# Patient Record
Sex: Female | Born: 1955 | Race: White | Hispanic: No | Smoking: Former smoker
Health system: Southern US, Community
[De-identification: ages and names within clinical notes are randomized; demographics above are authoritative.]

## PROBLEM LIST (undated history)

## (undated) DIAGNOSIS — R251 Tremor, unspecified: Secondary | ICD-10-CM

## (undated) DIAGNOSIS — J449 Chronic obstructive pulmonary disease, unspecified: Secondary | ICD-10-CM

## (undated) DIAGNOSIS — F1010 Alcohol abuse, uncomplicated: Secondary | ICD-10-CM

## (undated) DIAGNOSIS — R42 Dizziness and giddiness: Secondary | ICD-10-CM

## (undated) DIAGNOSIS — I10 Essential (primary) hypertension: Secondary | ICD-10-CM

## (undated) DIAGNOSIS — R413 Other amnesia: Secondary | ICD-10-CM

## (undated) HISTORY — DX: Essential (primary) hypertension: I10

## (undated) HISTORY — DX: Dizziness and giddiness: R42

## (undated) HISTORY — PX: OTHER SURGICAL HISTORY: SHX169

## (undated) HISTORY — DX: Tremor, unspecified: R25.1

## (undated) HISTORY — DX: Other amnesia: R41.3

## (undated) HISTORY — PX: TONSILLECTOMY: SUR1361

## (undated) HISTORY — DX: Chronic obstructive pulmonary disease, unspecified: J44.9

## (undated) HISTORY — DX: Alcohol abuse, uncomplicated: F10.10

---

## 2018-08-15 ENCOUNTER — Other Ambulatory Visit: Payer: Self-pay | Admitting: Family Medicine

## 2018-08-15 DIAGNOSIS — R413 Other amnesia: Secondary | ICD-10-CM

## 2018-08-19 ENCOUNTER — Ambulatory Visit
Admission: RE | Admit: 2018-08-19 | Discharge: 2018-08-19 | Disposition: A | Discharge status: Home / Self Care | Payer: BLUE CROSS/BLUE SHIELD | Source: Ambulatory Visit | Attending: Family Medicine | Admitting: Family Medicine

## 2018-08-19 DIAGNOSIS — R413 Other amnesia: Secondary | ICD-10-CM

## 2018-09-01 ENCOUNTER — Encounter: Payer: Self-pay | Admitting: *Deleted

## 2018-09-01 ENCOUNTER — Ambulatory Visit: Payer: BLUE CROSS/BLUE SHIELD | Admitting: Diagnostic Neuroimaging

## 2018-09-01 ENCOUNTER — Encounter: Payer: Self-pay | Admitting: Diagnostic Neuroimaging

## 2018-09-01 VITALS — BP 122/83 | HR 80 | Ht 64.0 in | Wt 124.6 lb

## 2018-09-01 DIAGNOSIS — R413 Other amnesia: Secondary | ICD-10-CM | POA: Diagnosis not present

## 2018-09-01 NOTE — Patient Instructions (Signed)
-   reduce alcohol use (< 5 drinks per week)  - continue vitamin support  - brain healthy activities

## 2018-09-01 NOTE — Progress Notes (Signed)
[facility]  PATIENT: Lauren Elliott DOB: October 07, 1956  REFERRING CLINICIAN: A Brake HISTORY FROM: patient  REASON FOR VISIT: new consult    HISTORICAL  CHIEF COMPLAINT:  Chief Complaint  Patient presents with  . New Patient (Initial Visit)    Rm 7,   . Memory Loss    Dr. Drema Pry.  In [place], low K, Na 09/2017 ) may have memory loss.   . Tremors    intermittent tremors (whole body and then sometimes hands).     HISTORY OF PRESENT ILLNESS:   62 year old female here for evaluation of memory loss.  October 2018 patient was living in [place] when she developed confusion and memory loss.  Patient was admitted to the hospital due to severe low sodium and low potassium levels.  She was in the intensive care unit and treated with IV fluid and electrolyte replacement.  Patient improved and was discharged home.  Patient now living in [place].  Patient notes some mild memory loss but nothing significant.  She is able to take care of all of her activities of daily living.  She has noticed some tremor which also started around October 2018.  Patient drinks 6 to 12 ounces of alcohol per day.  Review of referral notes indicates that patient's daughter reports higher level of alcohol intake than patient admitting to.  Apparently patient is drinking up to 2-3 mixed drinks per day plus higher amounts in the past (6 beers per day).  However patient denies this level of drinking today.  She states that she drinks 2-3 times per week, but sometimes on a daily basis.   REVIEW OF SYSTEMS: Full 14 system review of systems performed and negative with exception of: Weight loss fatigue easy bruising memory loss insomnia sleepiness numbness weakness tremor joint pain incontinence diarrhea itching swelling shortness of breath too much sleep decreased energy.  ALLERGIES: Not on File  HOME MEDICATIONS: Outpatient Medications Prior to Visit  Medication Sig Dispense Refill  . acetaminophen  (TYLENOL) 325 MG tablet Take 325 mg by mouth every 4 (four) hours as needed.    Marland Kitchen albuterol (VENTOLIN HFA) 108 (90 Base) MCG/ACT inhaler Inhale 2 puffs into the lungs every 6 (six) hours as needed for wheezing or shortness of breath.    Marland Kitchen amLODipine (NORVASC) 5 MG tablet Take 5 mg by mouth daily.  0  . aspirin EC 81 MG tablet Take 81 mg by mouth daily.    . Multiple Vitamin (MULTIVITAMIN) tablet Take 1 tablet by mouth daily.    Marland Kitchen STIOLTO RESPIMAT 2.5-2.5 MCG/ACT AERS INHALE 2 PUFFS BY MOUTH EVERY DAY  0  . ondansetron (ZOFRAN) 4 MG tablet   0   No facility-administered medications prior to visit.     PAST MEDICAL HISTORY: Past Medical History:  Diagnosis Date  . Alcohol abuse   . COPD (chronic obstructive pulmonary disease) (HCC)   . Dizziness   . Hypertension   . Memory loss   . Tremor     PAST SURGICAL HISTORY: Past Surgical History:  Procedure Laterality Date  . BTL    . TONSILLECTOMY      FAMILY HISTORY: Family History  Problem Relation Age of Onset  . Kidney cancer Mother   . Failure to thrive Mother   . CAD Father   . COPD Father   . Renal cancer Maternal Grandmother   . Heart attack Maternal Grandfather   . Heart failure Paternal Grandmother   . Heart failure Paternal Grandfather   .  Cancer Brother   . Stroke Maternal Aunt     SOCIAL HISTORY: Social History   Socioeconomic History  . Marital status: Legally Separated    Spouse name: Not on file  . Number of children: Not on file  . Years of education: Not on file  . Highest education level: Not on file  Occupational History  . Not on file  Social Needs  . Financial resource strain: Not on file  . Food insecurity:    Worry: Not on file    Inability: Not on file  . Transportation needs:    Medical: Not on file    Non-medical: Not on file  Tobacco Use  . Smoking status: Former Games developer  . Smokeless tobacco: Never Used  Substance and Sexual Activity  . Alcohol use: Yes    Comment: 6-12 oz daily  .  Drug use: Not Currently    Comment: quit yrs ago  . Sexual activity: Not on file  Lifestyle  . Physical activity:    Days per week: Not on file    Minutes per session: Not on file  . Stress: Not on file  Relationships  . Social connections:    Talks on phone: Not on file    Gets together: Not on file    Attends religious service: Not on file    Active member of club or organization: Not on file    Attends meetings of clubs or organizations: Not on file    Relationship status: Not on file  . Intimate partner violence:    Fear of current or ex partner: Not on file    Emotionally abused: Not on file    Physically abused: Not on file    Forced sexual activity: Not on file  Other Topics Concern  . Not on file  Social History Narrative   Lives at home with partner, Silvestre Moment.  Has 3 children.  Retired Charity fundraiser. Electronics engineer     PHYSICAL EXAM  GENERAL EXAM/CONSTITUTIONAL: Vitals:  Vitals:   09/01/18 1526  BP: 122/83  Pulse: 80  Weight: 124 lb 9.6 oz (56.5 kg)  Height: 5\' 4"  (1.626 m)     Body mass index is 21.39 kg/m. Wt Readings from Last 3 Encounters:  09/01/18 124 lb 9.6 oz (56.5 kg)     Patient is in no distress; well developed, nourished and groomed; neck is supple  CARDIOVASCULAR:  Examination of carotid arteries is normal; no carotid bruits  Regular rate and rhythm, no murmurs  Examination of peripheral vascular system by observation and palpation is normal  EYES:  Ophthalmoscopic exam of optic discs and posterior segments is normal; no papilledema or hemorrhages  No exam data present  MUSCULOSKELETAL:  Gait, strength, tone, movements noted in Neurologic exam below  NEUROLOGIC: MENTAL STATUS:  MMSE - Mini Mental State Exam 09/01/2018  Orientation to time 4  Orientation to Place 5  Registration 3  Attention/ Calculation 4  Recall 2  Language- name 2 objects 2  Language- repeat 1  Language- follow 3 step command 3  Language- read & follow  direction 1  Write a sentence 1  Copy design 1  Total score 27    awake, alert, oriented to person, place and time  recent and remote memory intact  normal attention and concentration  language fluent, comprehension intact, naming intact  fund of knowledge appropriate  CRANIAL NERVE:   2nd - no papilledema on fundoscopic exam  2nd, 3rd, 4th, 6th - pupils equal and  reactive to light, visual fields full to confrontation, extraocular muscles intact, no nystagmus  5th - facial sensation symmetric  7th - facial strength symmetric  8th - hearing intact  9th - palate elevates symmetrically, uvula midline  11th - shoulder shrug symmetric  12th - tongue protrusion midline  MOTOR:   normal bulk and tone, full strength in the BUE, BLE  POSTURAL TREMOR IN BUE AND HEAD  SENSORY:   normal and symmetric to light touch, temperature, vibration  COORDINATION:   finger-nose-finger, fine finger movements normal  REFLEXES:   deep tendon reflexes TRACE and symmetric  GAIT/STATION:   narrow based gait; MILD DIFF WITH TANDEM GAIT     DIAGNOSTIC DATA (LABS, IMAGING, TESTING) - I reviewed patient records, labs, notes, testing and imaging myself where available.  No results found for: WBC, HGB, HCT, MCV, PLT No results found for: NA, K, CL, CO2, GLUCOSE, BUN, CREATININE, CALCIUM, PROT, ALBUMIN, AST, ALT, ALKPHOS, BILITOT, GFRNONAA, GFRAA No results found for: CHOL, HDL, LDLCALC, LDLDIRECT, TRIG, CHOLHDL No results found for: FBXU3Y No results found for: VITAMINB12 No results found for: TSH   08/19/18 CT head [I reviewed images myself and agree with interpretation. -VRP]  - normal    ASSESSMENT AND PLAN  62 y.o. year old female here with memory loss since October 2018, when patient was critically ill in the hospital due to electrolyte derangements.  Also with questionable history of alcohol abuse by chart review, although patient downplays and denies any significant  excessive alcohol use.   Dx:  1. Memory loss     PLAN:  - reduce alcohol use (< 5 drinks per week) - continue vitamin support - brain healthy activities  Return if symptoms worsen or fail to improve, for return to PCP.    Suanne Marker, MD 09/01/2018, 3:43 PM Certified in Neurology, Neurophysiology and Neuroimaging  [facility] [address], [address] [place], [place] [address] [phone]

## 2018-09-05 ENCOUNTER — Encounter: Payer: Self-pay | Admitting: Diagnostic Neuroimaging

## 2018-09-25 ENCOUNTER — Other Ambulatory Visit: Payer: Self-pay | Admitting: Family Medicine

## 2018-09-25 DIAGNOSIS — R1900 Intra-abdominal and pelvic swelling, mass and lump, unspecified site: Secondary | ICD-10-CM

## 2018-09-30 ENCOUNTER — Encounter

## 2018-09-30 ENCOUNTER — Ambulatory Visit: Payer: BLUE CROSS/BLUE SHIELD | Admitting: Neurology

## 2018-10-03 ENCOUNTER — Ambulatory Visit
Admission: RE | Admit: 2018-10-03 | Discharge: 2018-10-03 | Disposition: A | Discharge status: Home / Self Care | Payer: BLUE CROSS/BLUE SHIELD | Source: Ambulatory Visit | Attending: Family Medicine | Admitting: Family Medicine

## 2018-10-03 DIAGNOSIS — R1900 Intra-abdominal and pelvic swelling, mass and lump, unspecified site: Secondary | ICD-10-CM

## 2018-11-25 ENCOUNTER — Encounter

## 2019-01-24 IMAGING — US US ABDOMEN COMPLETE
1 series · 14 of 25 positions shown · non-contrast
Comparison: None.

CLINICAL DATA: Abdominal swelling.  Assess for ascites.

EXAM:
ABDOMEN ULTRASOUND COMPLETE

[Series 1: us abdomen complete · 0.17mm/px · 14 of 87 slices shown]
[im 1/87]
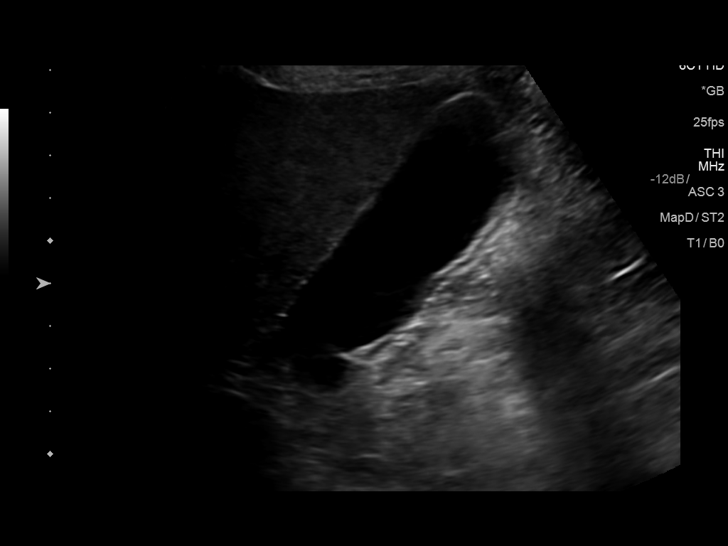
[im 8/87]
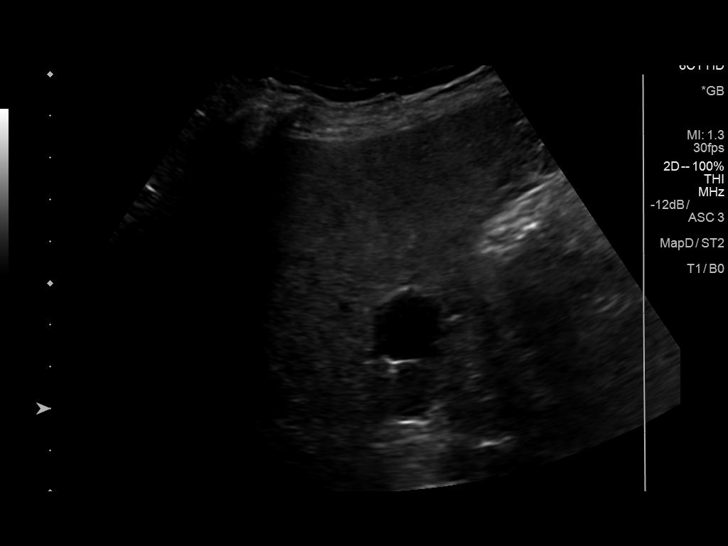
[im 15/87]
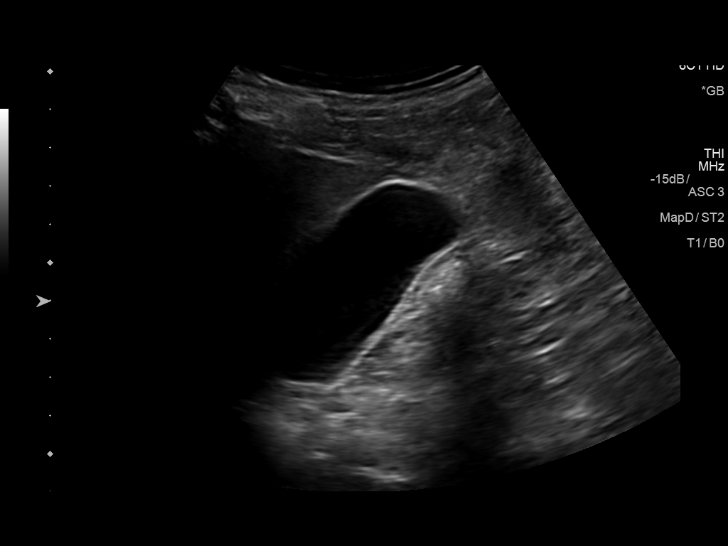
[im 22/87]
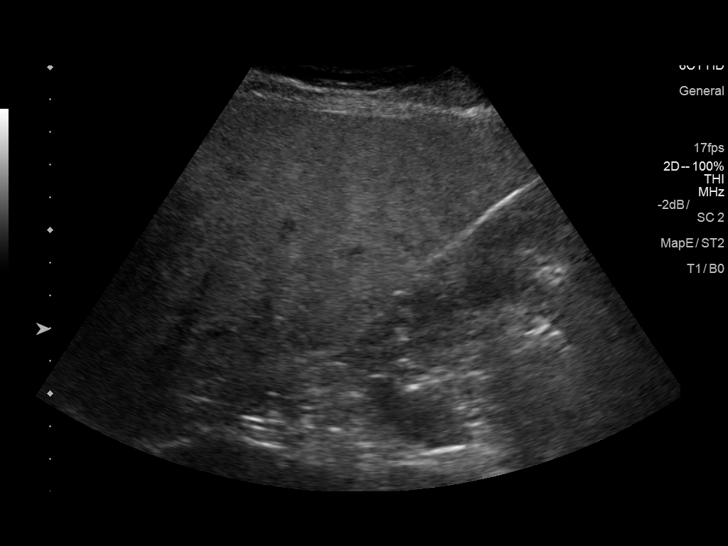
[im 29/87]
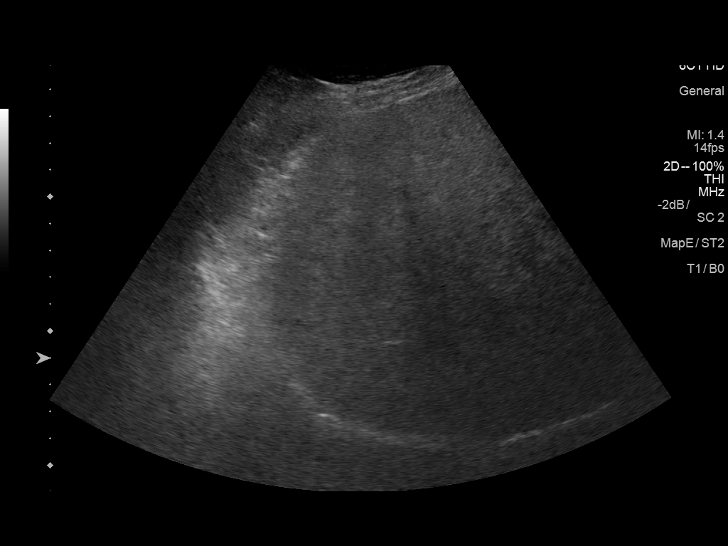
[im 33/87]
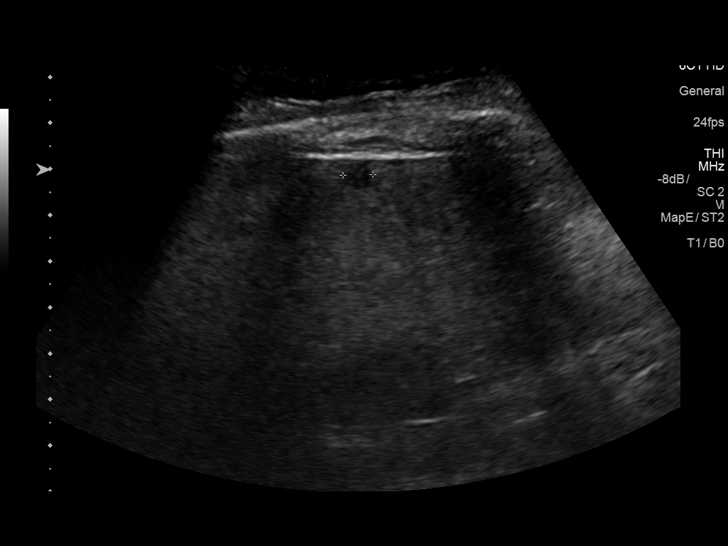
[im 40/87]
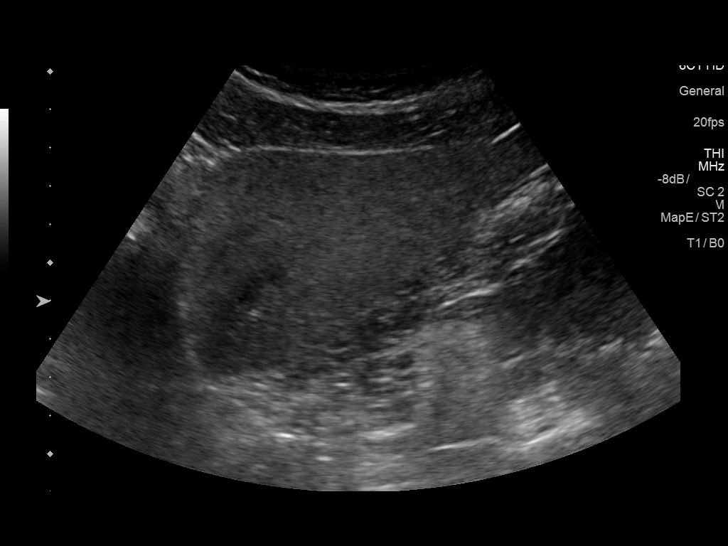
[im 47/87]
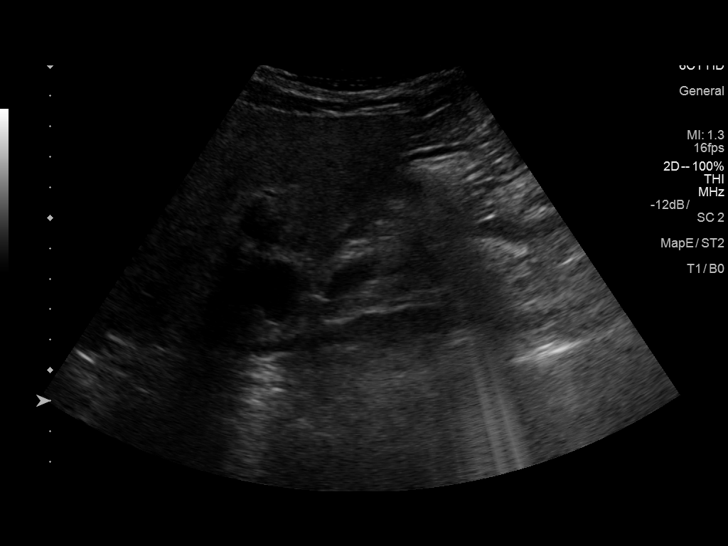
[im 54/87]
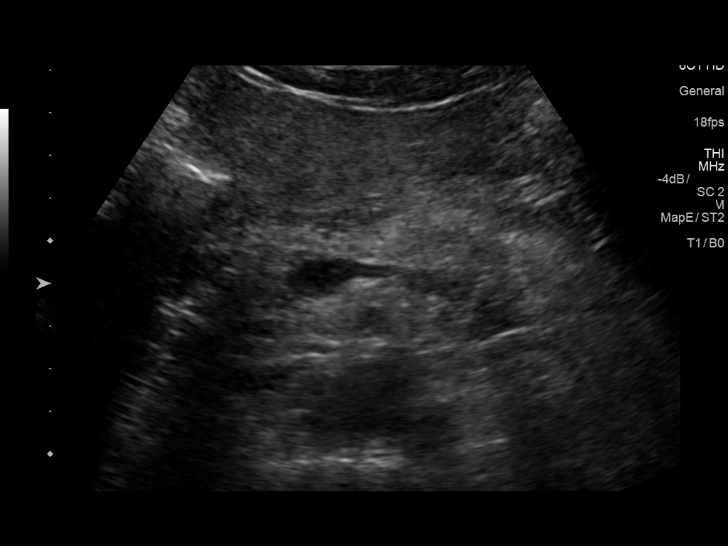
[im 58/87]
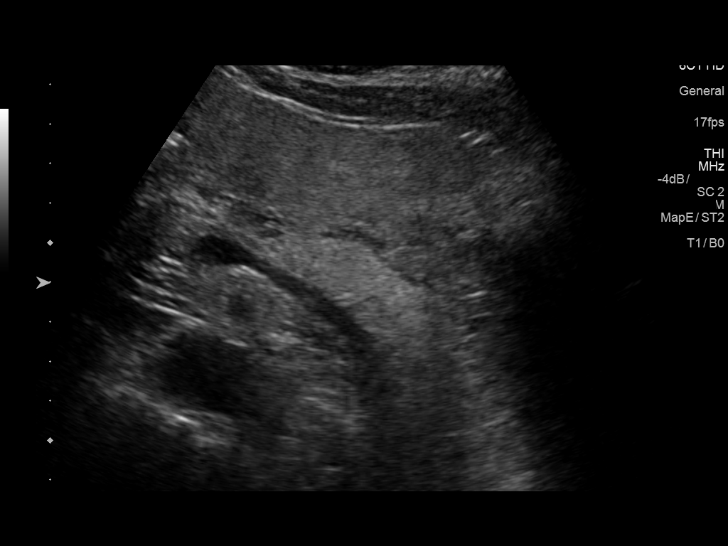
[im 65/87]
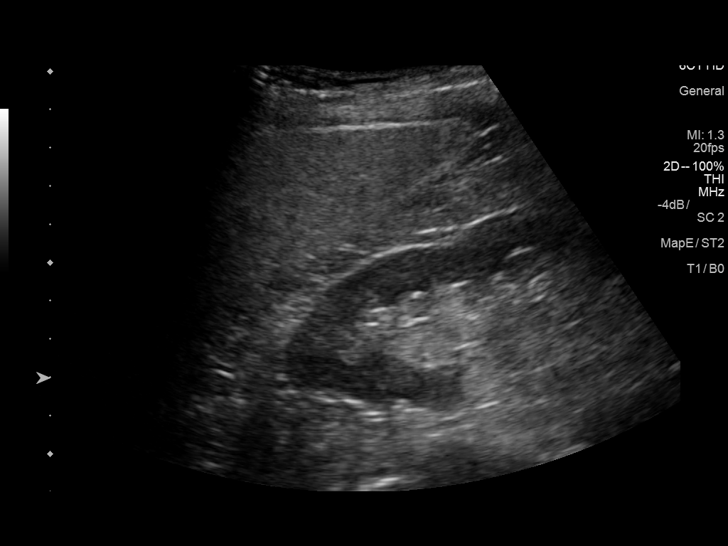
[im 72/87]
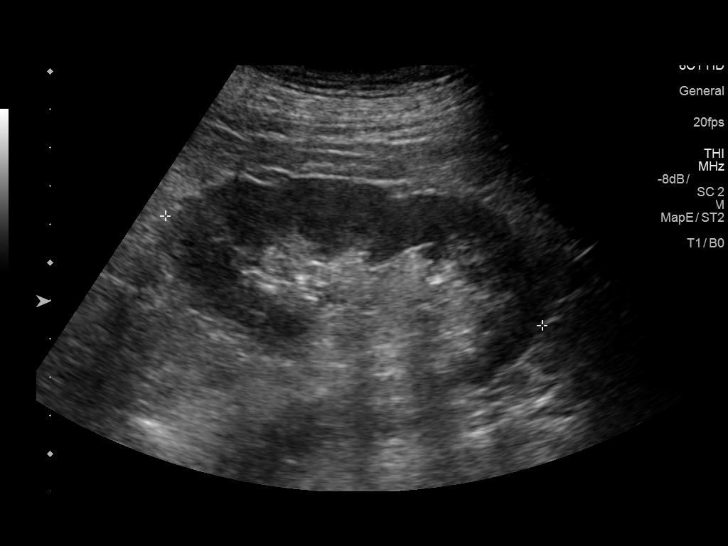
[im 79/87]
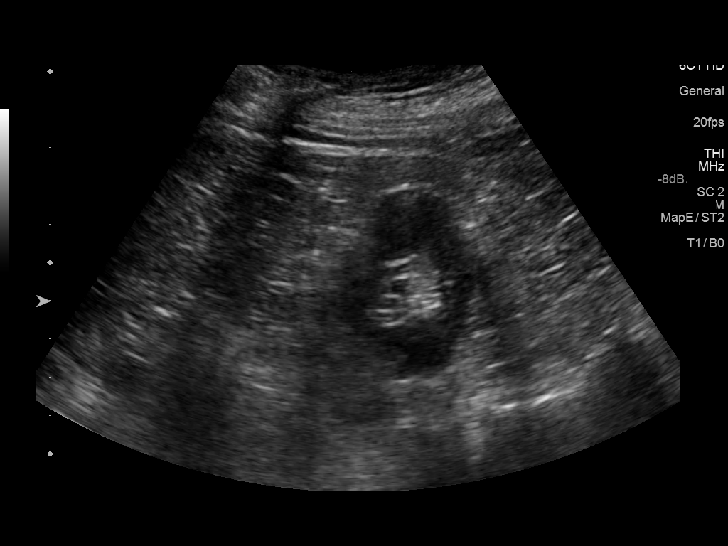
[im 87/87]
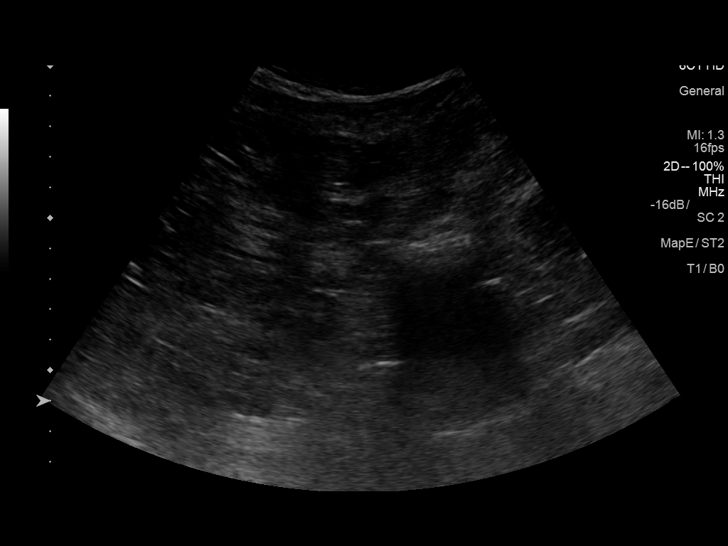

[14 of 25 positions shown; findings below may reference images not displayed]

FINDINGS: Gallbladder: No gallstones or wall thickening visualized. No
sonographic Murphy sign noted by sonographer.

Common bile duct: Diameter: 3.7 mm

Liver: There is inhomogeneous increased echotexture of the liver.
There is a 0.7 cm cyst in the right lobe liver. Portal vein is
patent on color Doppler imaging with normal direction of blood flow
towards the liver.

IVC: No abnormality visualized.

Pancreas: Visualized portion unremarkable.

Spleen: Size and appearance within normal limits.

Right Kidney: Length: 10.6 cm. Echogenicity within normal limits. No
mass or hydronephrosis visualized.

Left Kidney: Length: 10.3 cm. Echogenicity within normal limits. No
mass or hydronephrosis visualized.

Abdominal aorta: No aneurysm visualized.

Other findings: None.  No ascites is identified.
IMPRESSION: No ascites identified.

Inhomogeneous increased echotexture of the liver. This is
nonspecific but can be seen in fatty infiltration of liver. 0.7 cm
simple cyst in the right lobe liver.

## 2019-12-15 IMAGING — CT CT HEAD W/O CM
2 series · 16 of 40 positions shown, 20 images · non-contrast
Comparison: None.

CLINICAL DATA: Recent memory loss

EXAM:
CT HEAD WITHOUT CONTRAST
TECHNIQUE: Contiguous axial images were obtained from the base of the skull
through the vertex without intravenous contrast.

[Series 2: head w/(date) · axial · 0.41mm/px · z∈[-125,+15]mm · 13 of 34 slices shown, 17 images]
[im 3/34  brain]
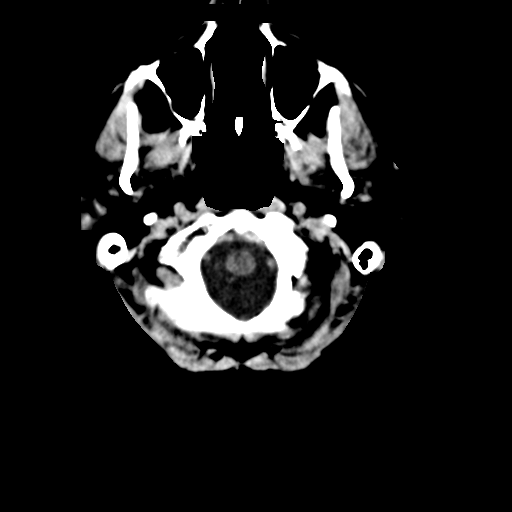
[im 3/34  bone]
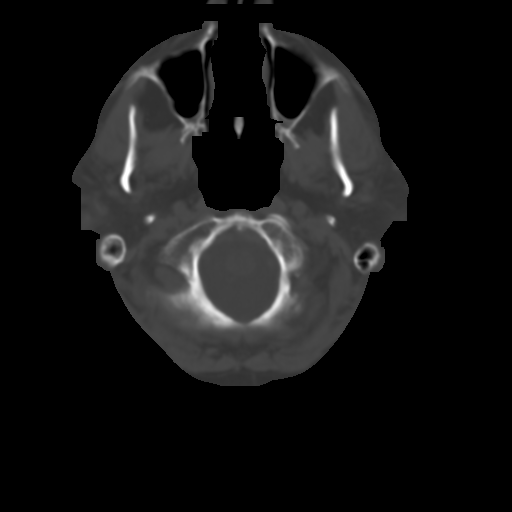
[im 5/34  brain]
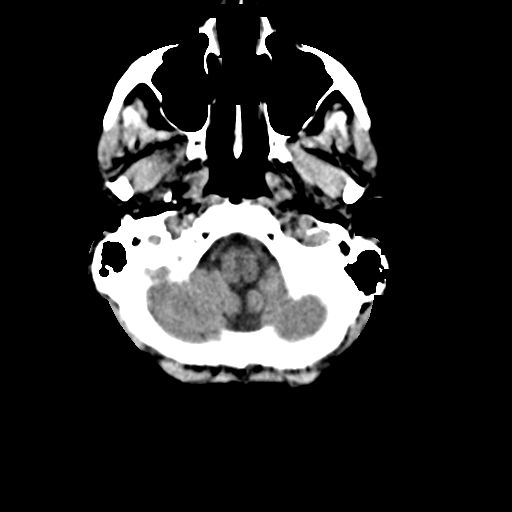
[im 7/34  brain]
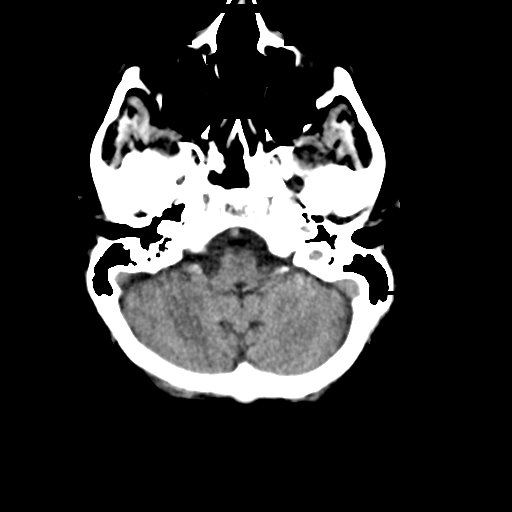
[im 10/34  brain]
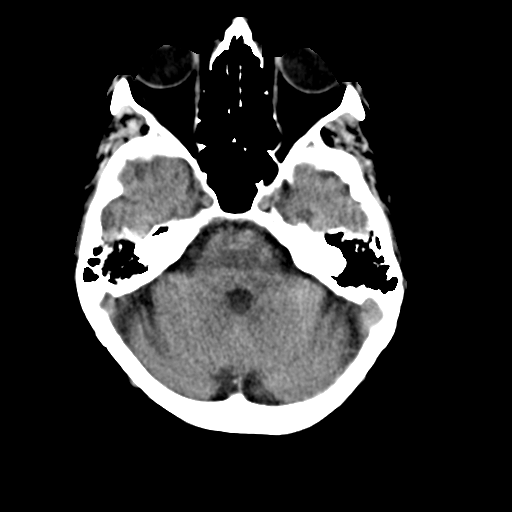
[im 12/34  brain]
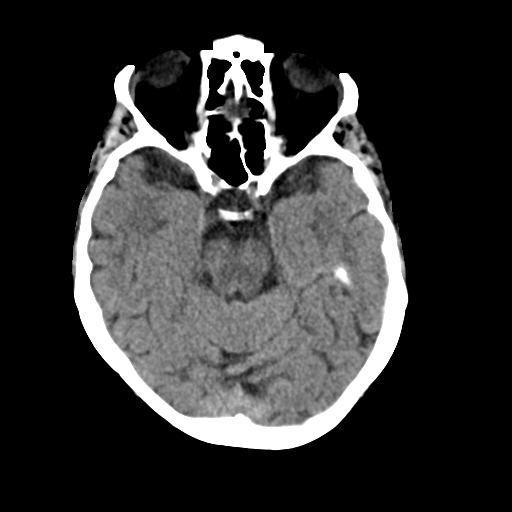
[im 12/34  bone]
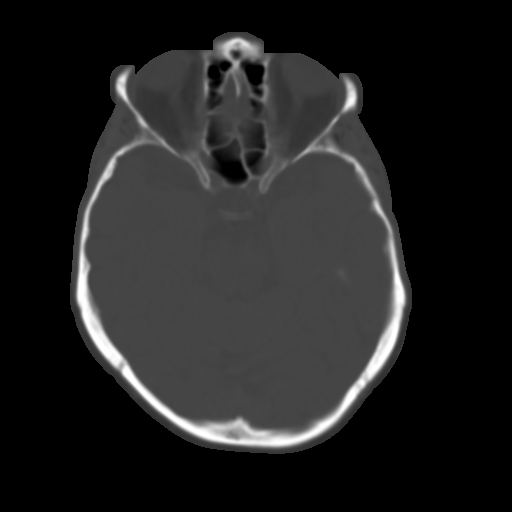
[im 14/34  brain]
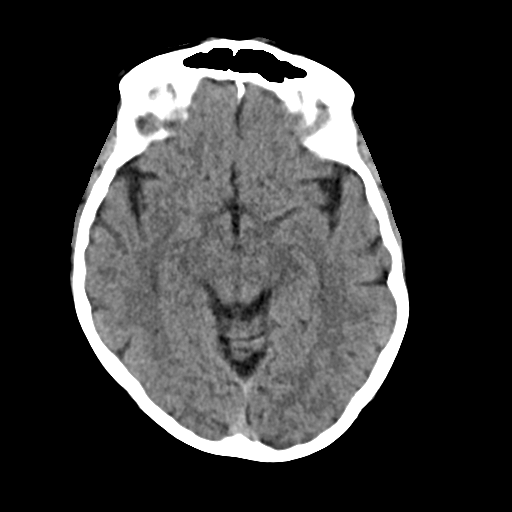
[im 18/34  brain]
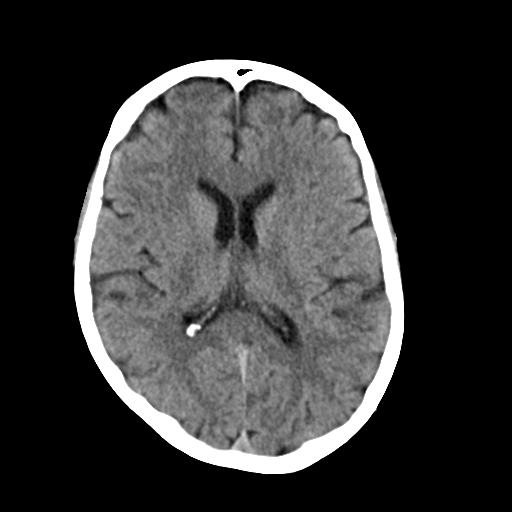
[im 20/34  brain]
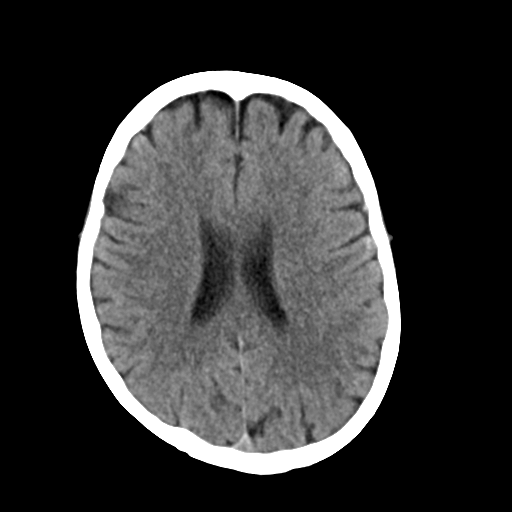
[im 22/34  brain]
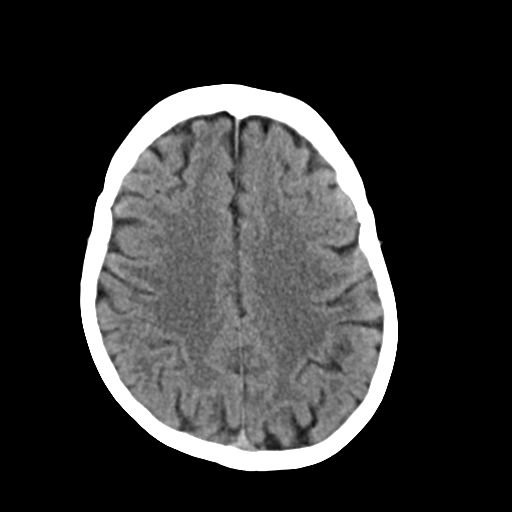
[im 22/34  bone]
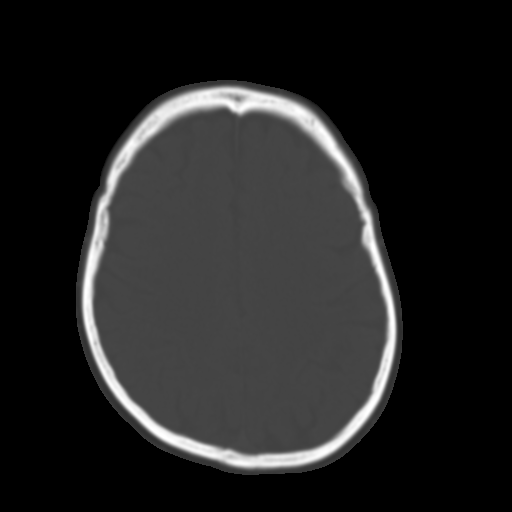
[im 24/34  brain]
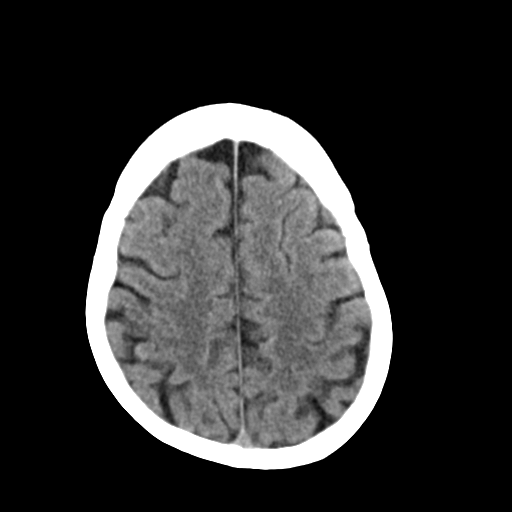
[im 27/34  brain]
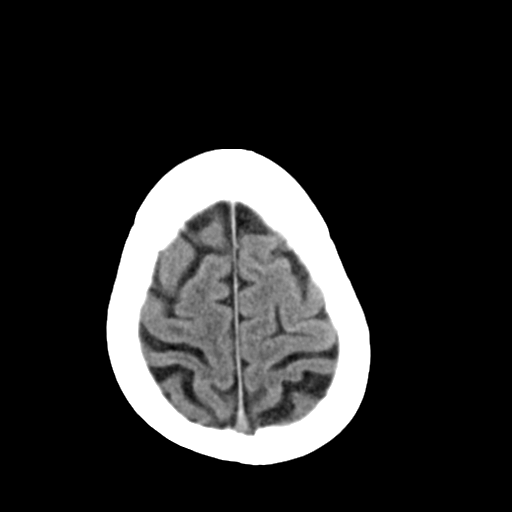
[im 29/34  brain]
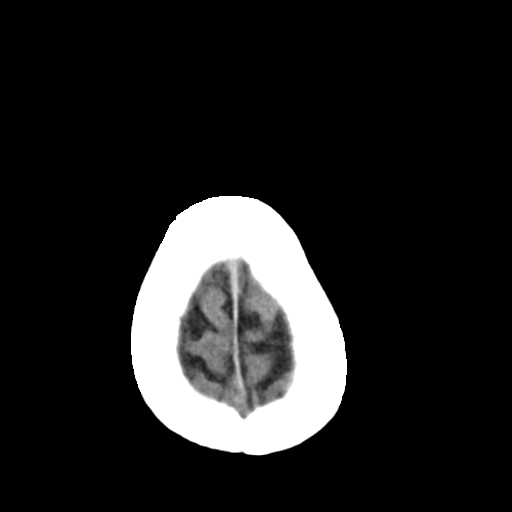
[im 31/34  brain]
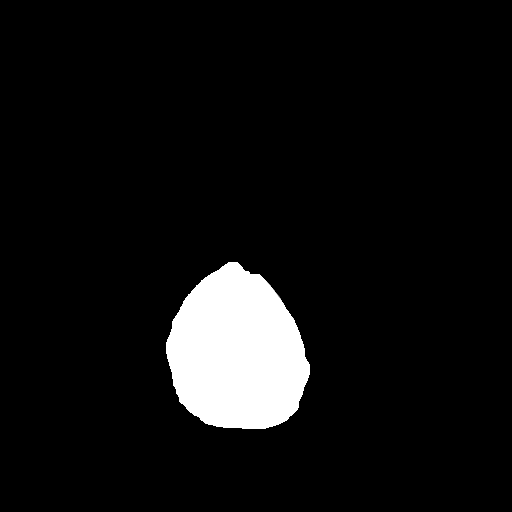
[im 31/34  bone]
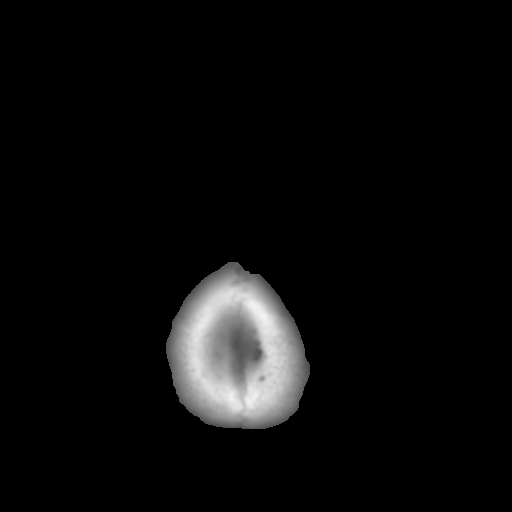

[Series 4: cor · coronal · 0.33mm/px · 3 of 69 slices shown]
[im 23/69  brain]
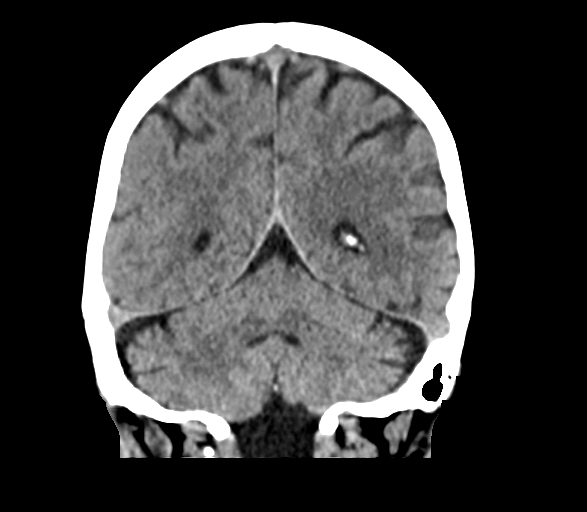
[im 31/69  brain]
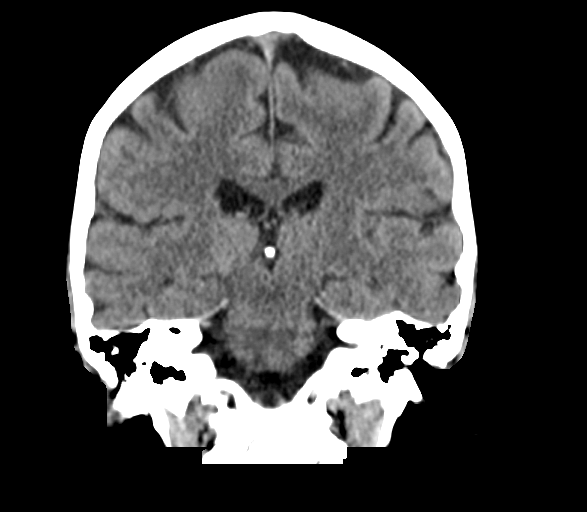
[im 38/69  brain]
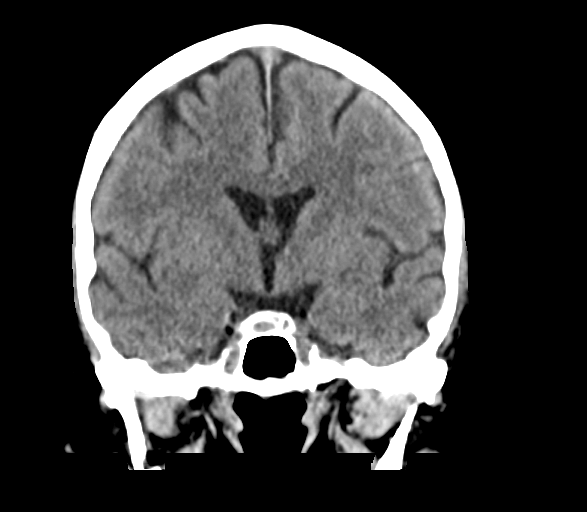

[16 of 40 positions shown; findings below may reference images not displayed]

FINDINGS: Brain: No evidence of acute infarction, hemorrhage, hydrocephalus,
extra-axial collection or mass lesion/mass effect.

Vascular: No hyperdense vessel or unexpected calcification.

Skull: Normal. Negative for fracture or focal lesion.

Sinuses/Orbits: No acute finding.

Other: None.
IMPRESSION: Normal head CT for age

## 2020-04-29 DIAGNOSIS — F1010 Alcohol abuse, uncomplicated: Secondary | ICD-10-CM | POA: Diagnosis not present

## 2020-04-29 DIAGNOSIS — J449 Chronic obstructive pulmonary disease, unspecified: Secondary | ICD-10-CM | POA: Diagnosis not present

## 2020-04-29 DIAGNOSIS — I10 Essential (primary) hypertension: Secondary | ICD-10-CM | POA: Diagnosis not present

## 2020-04-29 DIAGNOSIS — K582 Mixed irritable bowel syndrome: Secondary | ICD-10-CM | POA: Diagnosis not present

## 2020-04-29 DIAGNOSIS — E559 Vitamin D deficiency, unspecified: Secondary | ICD-10-CM | POA: Diagnosis not present

## 2020-04-29 DIAGNOSIS — R110 Nausea: Secondary | ICD-10-CM | POA: Diagnosis not present

## 2020-07-01 DIAGNOSIS — R69 Illness, unspecified: Secondary | ICD-10-CM | POA: Diagnosis not present

## 2020-08-09 DIAGNOSIS — E782 Mixed hyperlipidemia: Secondary | ICD-10-CM | POA: Diagnosis not present

## 2020-08-09 DIAGNOSIS — Z0000 Encounter for general adult medical examination without abnormal findings: Secondary | ICD-10-CM | POA: Diagnosis not present

## 2020-08-09 DIAGNOSIS — I10 Essential (primary) hypertension: Secondary | ICD-10-CM | POA: Diagnosis not present

## 2020-08-09 DIAGNOSIS — K582 Mixed irritable bowel syndrome: Secondary | ICD-10-CM | POA: Diagnosis not present

## 2020-08-09 DIAGNOSIS — E2839 Other primary ovarian failure: Secondary | ICD-10-CM | POA: Diagnosis not present

## 2020-08-09 DIAGNOSIS — Z1231 Encounter for screening mammogram for malignant neoplasm of breast: Secondary | ICD-10-CM | POA: Diagnosis not present

## 2020-08-09 DIAGNOSIS — E559 Vitamin D deficiency, unspecified: Secondary | ICD-10-CM | POA: Diagnosis not present

## 2020-08-09 DIAGNOSIS — R634 Abnormal weight loss: Secondary | ICD-10-CM | POA: Diagnosis not present

## 2020-08-09 DIAGNOSIS — J449 Chronic obstructive pulmonary disease, unspecified: Secondary | ICD-10-CM | POA: Diagnosis not present

## 2020-08-11 ENCOUNTER — Other Ambulatory Visit: Payer: Self-pay | Admitting: Family Medicine

## 2020-08-11 DIAGNOSIS — Z1231 Encounter for screening mammogram for malignant neoplasm of breast: Secondary | ICD-10-CM

## 2020-08-11 DIAGNOSIS — R5381 Other malaise: Secondary | ICD-10-CM

## 2020-08-12 ENCOUNTER — Other Ambulatory Visit: Payer: Self-pay | Admitting: Family Medicine

## 2020-08-12 DIAGNOSIS — E2839 Other primary ovarian failure: Secondary | ICD-10-CM

## 2020-09-08 ENCOUNTER — Other Ambulatory Visit: Payer: Self-pay

## 2020-09-08 ENCOUNTER — Ambulatory Visit
Admission: RE | Admit: 2020-09-08 | Discharge: 2020-09-08 | Disposition: A | Discharge status: Home / Self Care | Payer: BLUE CROSS/BLUE SHIELD | Source: Ambulatory Visit | Attending: Family Medicine | Admitting: Family Medicine

## 2020-09-08 DIAGNOSIS — Z1231 Encounter for screening mammogram for malignant neoplasm of breast: Secondary | ICD-10-CM

## 2020-11-22 ENCOUNTER — Ambulatory Visit
Admission: RE | Admit: 2020-11-22 | Discharge: 2020-11-22 | Disposition: A | Discharge status: Home / Self Care | Payer: Medicare HMO | Source: Ambulatory Visit | Attending: Family Medicine | Admitting: Family Medicine

## 2020-11-22 ENCOUNTER — Other Ambulatory Visit: Payer: Self-pay

## 2020-11-22 DIAGNOSIS — E2839 Other primary ovarian failure: Secondary | ICD-10-CM

## 2020-11-22 DIAGNOSIS — M810 Age-related osteoporosis without current pathological fracture: Secondary | ICD-10-CM | POA: Diagnosis not present

## 2020-11-22 DIAGNOSIS — Z780 Asymptomatic menopausal state: Secondary | ICD-10-CM | POA: Diagnosis not present

## 2020-12-16 DIAGNOSIS — R140 Abdominal distension (gaseous): Secondary | ICD-10-CM | POA: Diagnosis not present

## 2020-12-16 DIAGNOSIS — R188 Other ascites: Secondary | ICD-10-CM | POA: Diagnosis not present

## 2020-12-19 DIAGNOSIS — R1900 Intra-abdominal and pelvic swelling, mass and lump, unspecified site: Secondary | ICD-10-CM | POA: Diagnosis not present

## 2020-12-19 DIAGNOSIS — K429 Umbilical hernia without obstruction or gangrene: Secondary | ICD-10-CM | POA: Diagnosis not present

## 2020-12-19 DIAGNOSIS — R140 Abdominal distension (gaseous): Secondary | ICD-10-CM | POA: Diagnosis not present

## 2020-12-19 DIAGNOSIS — R1084 Generalized abdominal pain: Secondary | ICD-10-CM | POA: Diagnosis not present

## 2020-12-19 DIAGNOSIS — R109 Unspecified abdominal pain: Secondary | ICD-10-CM | POA: Diagnosis not present

## 2020-12-19 DIAGNOSIS — F1010 Alcohol abuse, uncomplicated: Secondary | ICD-10-CM | POA: Diagnosis not present

## 2020-12-20 ENCOUNTER — Other Ambulatory Visit: Payer: Medicare HMO | Admitting: Physician Assistant

## 2020-12-20 DIAGNOSIS — R1900 Intra-abdominal and pelvic swelling, mass and lump, unspecified site: Secondary | ICD-10-CM

## 2020-12-20 DIAGNOSIS — R109 Unspecified abdominal pain: Secondary | ICD-10-CM

## 2021-01-03 ENCOUNTER — Other Ambulatory Visit: Payer: Self-pay

## 2021-01-03 ENCOUNTER — Ambulatory Visit
Admission: RE | Admit: 2021-01-03 | Discharge: 2021-01-03 | Disposition: A | Discharge status: Home / Self Care | Payer: Medicare HMO | Source: Ambulatory Visit | Attending: Physician Assistant | Admitting: Physician Assistant

## 2021-01-03 DIAGNOSIS — R109 Unspecified abdominal pain: Secondary | ICD-10-CM

## 2021-01-03 DIAGNOSIS — K3189 Other diseases of stomach and duodenum: Secondary | ICD-10-CM | POA: Diagnosis not present

## 2021-01-03 DIAGNOSIS — R140 Abdominal distension (gaseous): Secondary | ICD-10-CM | POA: Diagnosis not present

## 2021-01-03 DIAGNOSIS — I700 Atherosclerosis of aorta: Secondary | ICD-10-CM | POA: Diagnosis not present

## 2021-01-03 DIAGNOSIS — K5730 Diverticulosis of large intestine without perforation or abscess without bleeding: Secondary | ICD-10-CM | POA: Diagnosis not present

## 2021-01-03 DIAGNOSIS — R1900 Intra-abdominal and pelvic swelling, mass and lump, unspecified site: Secondary | ICD-10-CM

## 2021-01-03 MED ORDER — IOPAMIDOL (ISOVUE-300) INJECTION 61%
100.0000 mL | Freq: Once | INTRAVENOUS | Status: AC | PRN
  Administered 2021-01-03: 100 mL via INTRAVENOUS

## 2021-01-11 DIAGNOSIS — K582 Mixed irritable bowel syndrome: Secondary | ICD-10-CM | POA: Diagnosis not present

## 2021-01-11 DIAGNOSIS — R140 Abdominal distension (gaseous): Secondary | ICD-10-CM | POA: Diagnosis not present

## 2021-01-11 DIAGNOSIS — F1010 Alcohol abuse, uncomplicated: Secondary | ICD-10-CM | POA: Diagnosis not present

## 2021-01-11 DIAGNOSIS — Z1211 Encounter for screening for malignant neoplasm of colon: Secondary | ICD-10-CM | POA: Diagnosis not present

## 2021-01-24 DIAGNOSIS — Z1211 Encounter for screening for malignant neoplasm of colon: Secondary | ICD-10-CM | POA: Diagnosis not present

## 2021-02-14 DIAGNOSIS — J449 Chronic obstructive pulmonary disease, unspecified: Secondary | ICD-10-CM | POA: Diagnosis not present

## 2021-05-31 DIAGNOSIS — H524 Presbyopia: Secondary | ICD-10-CM | POA: Diagnosis not present

## 2021-08-23 DIAGNOSIS — I10 Essential (primary) hypertension: Secondary | ICD-10-CM | POA: Diagnosis not present

## 2021-08-23 DIAGNOSIS — J449 Chronic obstructive pulmonary disease, unspecified: Secondary | ICD-10-CM | POA: Diagnosis not present

## 2021-08-23 DIAGNOSIS — Z0000 Encounter for general adult medical examination without abnormal findings: Secondary | ICD-10-CM | POA: Diagnosis not present

## 2021-08-23 DIAGNOSIS — E559 Vitamin D deficiency, unspecified: Secondary | ICD-10-CM | POA: Diagnosis not present

## 2021-08-23 DIAGNOSIS — E782 Mixed hyperlipidemia: Secondary | ICD-10-CM | POA: Diagnosis not present

## 2021-09-03 DIAGNOSIS — H60501 Unspecified acute noninfective otitis externa, right ear: Secondary | ICD-10-CM | POA: Diagnosis not present

## 2021-09-03 DIAGNOSIS — L309 Dermatitis, unspecified: Secondary | ICD-10-CM | POA: Diagnosis not present

## 2021-09-11 ENCOUNTER — Other Ambulatory Visit: Payer: Self-pay | Admitting: Family Medicine

## 2021-09-11 DIAGNOSIS — Z1231 Encounter for screening mammogram for malignant neoplasm of breast: Secondary | ICD-10-CM

## 2021-09-14 ENCOUNTER — Ambulatory Visit
Admission: RE | Admit: 2021-09-14 | Discharge: 2021-09-14 | Disposition: A | Discharge status: Home / Self Care | Payer: Medicare HMO | Source: Ambulatory Visit | Attending: Family Medicine | Admitting: Family Medicine

## 2021-09-14 ENCOUNTER — Other Ambulatory Visit: Payer: Self-pay

## 2021-09-14 DIAGNOSIS — Z1231 Encounter for screening mammogram for malignant neoplasm of breast: Secondary | ICD-10-CM

## 2021-09-19 DIAGNOSIS — I10 Essential (primary) hypertension: Secondary | ICD-10-CM | POA: Diagnosis not present

## 2022-03-02 DIAGNOSIS — E559 Vitamin D deficiency, unspecified: Secondary | ICD-10-CM | POA: Diagnosis not present

## 2022-03-02 DIAGNOSIS — I10 Essential (primary) hypertension: Secondary | ICD-10-CM | POA: Diagnosis not present

## 2022-03-02 DIAGNOSIS — J449 Chronic obstructive pulmonary disease, unspecified: Secondary | ICD-10-CM | POA: Diagnosis not present

## 2022-03-02 DIAGNOSIS — M25559 Pain in unspecified hip: Secondary | ICD-10-CM | POA: Diagnosis not present

## 2022-03-02 DIAGNOSIS — Z0000 Encounter for general adult medical examination without abnormal findings: Secondary | ICD-10-CM | POA: Diagnosis not present

## 2022-03-02 DIAGNOSIS — R109 Unspecified abdominal pain: Secondary | ICD-10-CM | POA: Diagnosis not present

## 2022-03-02 DIAGNOSIS — E782 Mixed hyperlipidemia: Secondary | ICD-10-CM | POA: Diagnosis not present

## 2022-03-15 DIAGNOSIS — R350 Frequency of micturition: Secondary | ICD-10-CM | POA: Diagnosis not present

## 2022-03-15 DIAGNOSIS — L301 Dyshidrosis [pompholyx]: Secondary | ICD-10-CM | POA: Diagnosis not present

## 2022-04-30 DIAGNOSIS — R69 Illness, unspecified: Secondary | ICD-10-CM | POA: Diagnosis not present

## 2022-05-24 DIAGNOSIS — F4323 Adjustment disorder with mixed anxiety and depressed mood: Secondary | ICD-10-CM | POA: Diagnosis not present

## 2022-08-20 ENCOUNTER — Ambulatory Visit (INDEPENDENT_AMBULATORY_CARE_PROVIDER_SITE_OTHER): Payer: Medicare HMO | Admitting: Clinical

## 2022-08-20 DIAGNOSIS — F4321 Adjustment disorder with depressed mood: Secondary | ICD-10-CM
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
## 2022-08-20 NOTE — Progress Notes (Unsigned)
                Mellony Danziger, LCSW 

## 2022-08-24 DIAGNOSIS — Z0000 Encounter for general adult medical examination without abnormal findings: Secondary | ICD-10-CM | POA: Diagnosis not present

## 2022-08-24 DIAGNOSIS — I10 Essential (primary) hypertension: Secondary | ICD-10-CM | POA: Diagnosis not present

## 2022-08-24 DIAGNOSIS — J449 Chronic obstructive pulmonary disease, unspecified: Secondary | ICD-10-CM | POA: Diagnosis not present

## 2022-08-24 DIAGNOSIS — E782 Mixed hyperlipidemia: Secondary | ICD-10-CM | POA: Diagnosis not present

## 2022-08-24 DIAGNOSIS — E559 Vitamin D deficiency, unspecified: Secondary | ICD-10-CM | POA: Diagnosis not present

## 2022-08-28 ENCOUNTER — Ambulatory Visit (INDEPENDENT_AMBULATORY_CARE_PROVIDER_SITE_OTHER): Payer: Medicare HMO | Admitting: Clinical

## 2022-08-28 DIAGNOSIS — F4321 Adjustment disorder with depressed mood: Secondary | ICD-10-CM

## 2022-08-28 NOTE — Progress Notes (Signed)
                Ricco Dershem, LCSW 

## 2022-08-28 NOTE — Progress Notes (Signed)
[facility] Counselor/Therapist Progress Note  Patient ID: Lauren Elliott, MRN: [id number]    Date: 08/28/22  Time Spent: 9:30  am - 10:19 am : 49 Minutes  Treatment Type: Individual Therapy.  Reported Symptoms: Patient reported "pretty good, I guess"  when clinician inquired about patient's mood since last session. Patient reported depressed mood yesterday after disagreement with her partner. Patient became tearful during session when discussing disagreement with her partner. Patient reported continued difficulty falling asleep and staying asleep.   Mental Status Exam: Appearance:  Neat  Behavior: Appropriate  Motor: Normal  Speech/Language:  Clear and Coherent  Affect: Tearful  Mood: sad  Thought process: normal  Thought content:   WNL  Sensory/Perceptual disturbances:   WNL  Orientation: oriented to person and place  Attention: Good  Concentration: Good  Memory: WNL  Fund of knowledge:  Good  Insight:   Good  Judgment:  Good  Impulse Control: Good   Risk Assessment: Danger to Self:   Patient denied current suicidal ideation, homicidal ideation, and symptoms of psychosis.  Self-injurious Behavior: No Danger to Others: No Duty to Warn:no Physical Aggression / Violence:No  Access to Firearms a concern:  Patient reported antique gun in the home but no firing pin in the gun Gang Involvement:No   Subjective:  Patient stated, "I'm good, whatever helps" in response to recommendation for individual therapy.  Patient reported she misses the close relationships she had with family members prior to family members passing away. Patient reported she misses spending time with her daughters. Patient reported [place].   Interventions:  Clinician reviewed diagnosis and recommendations with patient. Clinician provided psycho education related to diagnosis of Adjustment Disorder with depressed mood and use of Cognitive Behavioral Therapy.  Clinician utilized a task centered approach in collaboration with patient to develop goals of treatment plan. Clinician requested patient complete thought record for homework.   Diagnosis:  Adjustment Disorder with depressed mood   Plan: Patient is to utilize [employer] Therapy, mindfulness, coping strategies, relaxation techniques, and develop healthy sleep habits to decrease symptoms associated with Adjustment Disorder with depressed mood.    Long-term goal:   Patient stated "peace of mind" as a long term goal.    Reduce depressive symptoms as evidence by reduction in depressed mood, decreased appetite, difficulty falling and staying asleep, crying, and irritability from 3 days per week to 0 days per week   Short-term goal:  Develop a strategy to re-establish communication with patient's daughter   Examine patient's sleep pattern/routine to develop strategies to improve sleep habits  Develop an understanding of the connection between patient's feelings of depressed mood, decreased appetite, difficulty falling asleep and staying asleep, crying, irritability and the impact on patient's thoughts and behaviors to develop coping strategies to utilize in response to stressors   Lauren Braun H. [place], LCSW                    Lauren Elliott, [place]

## 2022-08-30 ENCOUNTER — Ambulatory Visit: Payer: Medicare HMO | Admitting: Clinical

## 2022-09-04 ENCOUNTER — Ambulatory Visit: Payer: Medicare HMO | Admitting: Clinical

## 2022-09-04 DIAGNOSIS — F4321 Adjustment disorder with depressed mood: Secondary | ICD-10-CM

## 2022-09-04 NOTE — Progress Notes (Signed)
[facility] Counselor/Therapist Progress Note  Patient ID: Lauren Elliott, MRN: [id number]    Date: 09/04/22  Time Spent: 9:38 am - 10:22 am : 44 Minutes  Treatment Type: Individual Therapy.  Reported Symptoms: Patient reported she currently has a cold. Patient reported mood has fluctuated since last session.    Mental Status Exam: Appearance:  Could not assess  Behavior: Could not assess  Motor: Could not assess  Speech/Language:  Clear and Coherent  Affect: Appropriate  Mood: normal  Thought process: normal  Thought content:   WNL  Sensory/Perceptual disturbances:   WNL  Orientation: oriented to person and place  Attention: Good  Concentration: Good  Memory: WNL  Fund of knowledge:  Good  Insight:   Good  Judgment:  Good  Impulse Control: Good   Risk Assessment: Danger to Self:  No, patient denied current suicidal ideation and homicidal ideation Self-injurious Behavior: No Danger to Others: No Duty to Warn:no Physical Aggression / Violence:No  Access to Firearms a concern: No  Gang Involvement:No   Subjective:   Patient reported she completed thought record for homework. Patient identified being in the house and not being able to go out due to weather is a trigger for changes in her mood. Patient identified recent discussion of finances as a trigger for changes in mood. Patient reported she woke up thinking about financial stressors and was frustrated and depressed as a result. Patient identified not being able to follow though with plans due to financial challenges as a trigger for changes in mood. Patient reported multiple financial stressors: computer needs repairs, [place] of a planned [place] and unable to attend yard sales/flea markets to increase their income. Patient identified multiple financial stressors as triggers for changes in mood. Patient identified a discord in her church group as a stressor but reported she is trying not  to get involved in the conflict. Patient reported a recent song has been beneficial to her mood. Patient identified the following coping strategies: music, reading books that are positive.   Interventions: Cognitive Behavioral Therapy. Clinician conducted therapy session via phone from clinician's office at [facility] due to difficulties in patient connecting via webex. Patient provided verbal consent to proceed with session via phone and reported participating in session from her home. Reviewed patient's thought record for homework to identified triggers for changes in mood, provided psycho education related to use of coping strategies in response to triggers and/or changes in mood, discussed and identified coping strategies to utilize in response to triggers/changes in mood.    Diagnosis:  Adjustment Disorder with depressed mood     Plan: Patient is to utilize [employer] Therapy, mindfulness, coping strategies, relaxation techniques, and develop healthy sleep habits to decrease symptoms associated with Adjustment Disorder with depressed mood.    Long-term goal:   Patient stated "peace of mind" as a long term goal.     Reduce depressive symptoms as evidence by reduction in depressed mood, decreased appetite, difficulty falling and staying asleep, crying, and irritability from 3 days per week to 0 days per week. Target date of 08/29/23   Short-term goal:  Develop a strategy to re-establish communication with patient's daughter. Target date of 02/26/23   Examine patient's sleep pattern/routine to develop strategies to improve sleep habits. Target date of 02/26/23   Develop an understanding of the connection between patient's feelings of depressed mood, decreased appetite, difficulty falling asleep and staying asleep, crying, irritability and the  impact on patient's thoughts and behaviors to develop coping strategies to utilize in response to stressors. Target date of  02/26/23          Doree Barthel, LCSW

## 2022-09-11 ENCOUNTER — Ambulatory Visit: Payer: Medicare HMO | Admitting: Clinical

## 2022-09-11 DIAGNOSIS — L309 Dermatitis, unspecified: Secondary | ICD-10-CM | POA: Diagnosis not present

## 2022-09-11 DIAGNOSIS — U071 COVID-19: Secondary | ICD-10-CM | POA: Diagnosis not present

## 2022-09-21 ENCOUNTER — Ambulatory Visit (INDEPENDENT_AMBULATORY_CARE_PROVIDER_SITE_OTHER): Payer: Medicare HMO | Admitting: Clinical

## 2022-09-21 DIAGNOSIS — F4321 Adjustment disorder with depressed mood: Secondary | ICD-10-CM

## 2022-09-21 NOTE — Progress Notes (Signed)
                Terryl Niziolek, LCSW 

## 2022-09-21 NOTE — Progress Notes (Signed)
[facility] Counselor/Therapist Progress Note  Patient ID: Lauren Elliott, MRN: [id number],    Date: 09/21/2022  Time Spent: 10:41am- 11:22am : 41 minutes  Treatment Type: Individual Therapy  Reported Symptoms: Patient reported feeling tired and didn't sleep well last night. Patient stated, "mood is ok" when clinician inquired about mood since last session.   Mental Status Exam: Appearance:  Neat     Behavior: Appropriate  Motor: Normal  Speech/Language:  Clear and Coherent  Affect: Appropriate  Mood: normal  Thought process: normal  Thought content:   WNL  Sensory/Perceptual disturbances:   WNL  Orientation: oriented to person and place  Attention: Good  Concentration: Good  Memory: WNL  Fund of knowledge:  Good  Insight:   Good  Judgment:  Good  Impulse Control: Good   Risk Assessment: Danger to Self:  No Patient denied current suicidal ideation Self-injurious Behavior: No Danger to Others: No Patient denied current homicidal ideation Duty to Warn:no Physical Aggression / Violence:No  Access to Firearms a concern: No  Gang Involvement:No   Subjective: Patient reported she will be participating in a metaphysical fair tomorrow in [place] and looking forward to the event. Patient reported an upcoming lunch with friends, upcoming mediumship development class, and planning a friendsgiving for November that she is looking forward to attending. Patient reported a recent phone call with her daughter in [place] and reported it has been 3 months since they last spoke. Patient reported feeling disconnected from her daughter and reported in the past they talked more frequently. Patient reported during the phone call she was made aware that her daughter in [place] does not want her contact information shared with patient. Patient reported she told her daughter in [place] that she has some documents and a letter for her daughter in [place] and daughter in  [place] indicated she would share that information with her sister.  Patient reported she plans to wait for daughter's response in regards to sending the documents. Patient reported she is open to talking with her daughter about her feelings and plans to suggest setting a time to check in with daughter and her other children on a regular basis. Patient reported reading and listening to music has been helpful in response to stressors and reported she has not had to utilize those coping strategies often due to decrease in stressors since last session.   Interventions: Cognitive Behavioral Therapy and Motivational Interviewing Clinician conducted session via Webex video from clinician's home office. Patient provided verbal consent to proceed with telehealth session and participated in session from patient's home. Discussed upcoming events/opportunities patient has planned and the impact on patient's mood. Discussed patient's recent phone call with her daughter in [place], the impact on patient's mood/thoughts, and developed a strategy to utilize in response to patient feeling disconnected from her children. Reviewed coping strategies identified during last session and the outcome.     Diagnosis:  Adjustment Disorder with depressed mood     Plan: Patient is to utilize [employer] Therapy, mindfulness, coping strategies, relaxation techniques, and develop healthy sleep habits to decrease symptoms associated with Adjustment Disorder with depressed mood.    Frequency: Weekly  Modality: individual   Long-term goal:   Patient stated "peace of mind" as a long term goal.     Reduce depressive symptoms as evidence by reduction in depressed mood, decreased appetite, difficulty falling and staying asleep, crying, and irritability from 3 days per week to 0 days per week. Target date of 08/29/23  Short-term goal:  Develop a strategy to re-establish communication with patient's daughter. Target date of  02/26/23   Examine patient's sleep pattern/routine to develop strategies to improve sleep habits. Target date of 02/26/23   Develop an understanding of the connection between patient's feelings of depressed mood, decreased appetite, difficulty falling asleep and staying asleep, crying, irritability and the impact on patient's thoughts and behaviors to develop coping strategies to utilize in response to stressors. Target date of 02/26/23   09/21/22 Patient continues to progress towards long and short term goals.   Katherina Right, LCSW

## 2022-09-28 DIAGNOSIS — Z0000 Encounter for general adult medical examination without abnormal findings: Secondary | ICD-10-CM | POA: Diagnosis not present

## 2022-09-28 DIAGNOSIS — E559 Vitamin D deficiency, unspecified: Secondary | ICD-10-CM | POA: Diagnosis not present

## 2022-09-28 DIAGNOSIS — J449 Chronic obstructive pulmonary disease, unspecified: Secondary | ICD-10-CM | POA: Diagnosis not present

## 2022-09-28 DIAGNOSIS — M8580 Other specified disorders of bone density and structure, unspecified site: Secondary | ICD-10-CM | POA: Diagnosis not present

## 2022-09-28 DIAGNOSIS — I10 Essential (primary) hypertension: Secondary | ICD-10-CM | POA: Diagnosis not present

## 2022-09-28 DIAGNOSIS — Z23 Encounter for immunization: Secondary | ICD-10-CM | POA: Diagnosis not present

## 2022-09-28 DIAGNOSIS — E782 Mixed hyperlipidemia: Secondary | ICD-10-CM | POA: Diagnosis not present

## 2022-09-28 DIAGNOSIS — R600 Localized edema: Secondary | ICD-10-CM | POA: Diagnosis not present

## 2022-10-01 ENCOUNTER — Ambulatory Visit (INDEPENDENT_AMBULATORY_CARE_PROVIDER_SITE_OTHER): Payer: Medicare HMO | Admitting: Clinical

## 2022-10-01 DIAGNOSIS — F4321 Adjustment disorder with depressed mood: Secondary | ICD-10-CM

## 2022-10-01 NOTE — Progress Notes (Signed)
[facility] Counselor/Therapist Progress Note  Patient ID: Lauren Elliott, MRN: [id number],    Date: 10/01/2022  Time Spent: 10:31am 11:21am : 50 minutes  Treatment Type: Individual Therapy  Reported Symptoms: Patient reported feeling "grumpy" today and reported difficulty staying asleep  Mental Status Exam: Appearance:  Neat     Behavior: Appropriate  Motor: Normal  Speech/Language:  Clear and Coherent  Affect: Appropriate  Mood: Patient reported "grumpy" mood  Thought process: normal  Thought content:   WNL  Sensory/Perceptual disturbances:   WNL  Orientation: oriented to person, place, and time/date  Attention: Good  Concentration: Good  Memory: WNL  Fund of knowledge:  Good  Insight:   Good  Judgment:  Good  Impulse Control: Good   Risk Assessment: Danger to Self:  No Patient denied current suicidal ideation Self-injurious Behavior: No Danger to Others: No Patient denied current homicidal ideation Duty to Warn:no Physical Aggression / Violence:No  Access to Firearms a concern: No  Gang Involvement:No   Subjective: Patient reported she enjoyed recent metaphysical event and plans to participate in another metaphysical fair for a local homecoming event. Patient reported she feels "good" about the upcoming event. Patient reported she continues to participate on the planning committee for her church's friendsgiving. Patient reported feeling "grumpy" today due to recent difficulty staying asleep. Patient reported difficulty staying asleep approximately 3 nights per week. Patient reported difficulty falling back asleep after waking up in the middle of the night. Patient reported waking up in the middle of the night to use the restroom or due to a dream. Patient reported processing a dream or thoughts about her daily activities when trying to return to sleep. Patient reported she is considering taking a sleep aid. Patient reported she and significant other have  talked about taking walks but reported she is not currently getting daily exercise. Patient reported she is willing to try going for a walk daily and reported the walk may be an opportunity for socialization with her neighbors.   Interventions: Cognitive Behavioral Therapy. Clinician conducted session via Webex video from clinician's home office. Patient provided verbal consent to proceed with telehealth session and participated in session from patient's home. Patient provided verbal consent for patient's significant other to be present during session. Discussed patient's recent participation in metaphysical event and the impact on patient's mood. Provided psycho education related to healthy sleep habits. Explored barriers to staying asleep, discussed the impact on patient's mood, and identified strategies to improve sleep, such as, decreasing fluid intake prior to going to sleep, using ear plugs, listening to the sounds of rain, reading, taking a warm bath, relaxation techniques, and daily exercise. Clinician recommended patient discuss difficulty staying asleep with primary care physician and consult with physician prior to taking over the counter sleep aids.    Diagnosis:  Adjustment Disorder with depressed mood     Plan: Patient is to utilize [employer] Therapy, mindfulness, coping strategies, relaxation techniques, and develop healthy sleep habits to decrease symptoms associated with Adjustment Disorder with depressed mood.    Frequency: Weekly  Modality: individual    Long-term goal:   Patient stated "peace of mind" as a long term goal.     Reduce depressive symptoms as evidence by reduction in depressed mood, decreased appetite, difficulty falling and staying asleep, crying, and irritability from 3 days per week to 0 days per week. Target date of 08/29/23   Short-term goal:  Develop a strategy to re-establish communication with patient's daughter. Target date  of 02/26/23   Examine  patient's sleep pattern/routine to develop strategies to improve sleep habits. Target date of 02/26/23   Develop an understanding of the connection between patient's feelings of depressed mood, decreased appetite, difficulty falling asleep and staying asleep, crying, irritability and the impact on patient's thoughts and behaviors to develop coping strategies to utilize in response to stressors. Target date of 02/26/23   10/01/22 Patient continues to progress towards long and short term goals.    Katherina Right, LCSW

## 2022-10-01 NOTE — Progress Notes (Signed)
                Yeng Frankie, LCSW 

## 2022-10-15 ENCOUNTER — Ambulatory Visit (INDEPENDENT_AMBULATORY_CARE_PROVIDER_SITE_OTHER): Payer: Medicare HMO | Admitting: Clinical

## 2022-10-15 DIAGNOSIS — F4321 Adjustment disorder with depressed mood: Secondary | ICD-10-CM

## 2022-10-15 NOTE — Progress Notes (Signed)
[facility] Counselor/Therapist Progress Note  Patient ID: Lauren Elliott, MRN: [id number],    Date: 10/15/2022  Time Spent: 12:40pm - 1:20pm : 40 minutes   Treatment Type: Individual Therapy  Reported Symptoms: Patient reported improvement in sleep   Mental Status Exam: Appearance:  Neat     Behavior: Appropriate  Motor: Normal  Speech/Language:  Clear and Coherent  Affect: Appropriate  Mood: normal  Thought process: normal  Thought content:   WNL  Sensory/Perceptual disturbances:   WNL  Orientation: oriented to person, place, and situation  Attention: Good  Concentration: Good  Memory: WNL  Fund of knowledge:  Good  Insight:   Good  Judgment:  Good  Impulse Control: Good   Risk Assessment: Danger to Self:  No Patient denied current suicidal ideation Self-injurious Behavior: No Danger to Others: No Patient denied current homicidal ideation Duty to Warn:no Physical Aggression / Violence:No  Access to Firearms a concern: No  Gang Involvement:No   Subjective: Patient stated, "pretty good actually" since last session.  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 Patient stated, "it has affected my mood, I feel a lot more at peace, happier about the changes going on". Patient reported she has been making salsa, jam, and has been canning. Patient reported canning has been beneficial to her mood. Patient reported recent improvement in sleep. Patient reported she feels improvement in her mood contributes to recent improvement in sleep.  Patient stated, "not really well" in response to strategies discussed during last session to increase daily exercise. Patient reported "excuses" are a barrier to daily exercise. Patient reported she walks around [employer], [employer], and [employer] weekly. Patient reported she has a benefit through her insurance provider, [employer], that pays patient for  achieving a step goal. Patient reported she will start logging the number of steps she takes during the day.   Interventions: Cognitive Behavioral Therapy. Clinician conducted session via Webex video from clinician's home office. Patient consented to proceed with telehealth session and participated in session from patient's home. Patient reported difficulty initially logging in to today's session. Discussed changes in patient's mood and identified contributing factors to recent improvement in mood. Explored barriers for participating in daily exercise and motivation for participation. Discussed strategies to increase patient's daily exercise, such as, setting small goals, walking during commercials, walking around her house.    Diagnosis:  Adjustment Disorder with depressed mood     Plan: Patient is to utilize [employer] Therapy, mindfulness, coping strategies, relaxation techniques, and develop healthy sleep habits to decrease symptoms associated with Adjustment Disorder with depressed mood.    Frequency: Weekly  Modality: individual    Long-term goal:   Patient stated "peace of mind" as a long term goal.     Reduce depressive symptoms as evidence by reduction in depressed mood, decreased appetite, difficulty falling and staying asleep, crying, and irritability from 3 days per week to 0 days per week. Target date of 08/29/23   Short-term goal:  Develop a strategy to re-establish communication with patient's daughter. Target date of 02/26/23   Examine patient's sleep pattern/routine to develop strategies to improve sleep habits. Target date of 02/26/23   Develop an understanding of the connection between patient's feelings of depressed mood, decreased appetite, difficulty falling asleep and staying asleep, crying, irritability and the impact on patient's thoughts and behaviors to develop coping strategies to utilize in response to stressors.  Target date of 02/26/23   10/01/22 Patient  continues to progress towards long and short term goals.   Katherina Right, LCSW

## 2022-10-15 NOTE — Progress Notes (Signed)
                Sena Clouatre, LCSW 

## 2022-10-29 ENCOUNTER — Other Ambulatory Visit: Payer: Self-pay | Admitting: Family Medicine

## 2022-10-29 ENCOUNTER — Ambulatory Visit: Payer: Medicare HMO | Admitting: Clinical

## 2022-10-29 DIAGNOSIS — Z1231 Encounter for screening mammogram for malignant neoplasm of breast: Secondary | ICD-10-CM

## 2022-10-30 ENCOUNTER — Ambulatory Visit
Admission: RE | Admit: 2022-10-30 | Discharge: 2022-10-30 | Disposition: A | Discharge status: Home / Self Care | Payer: Medicare HMO | Source: Ambulatory Visit | Attending: Family Medicine | Admitting: Family Medicine

## 2022-10-30 DIAGNOSIS — Z1231 Encounter for screening mammogram for malignant neoplasm of breast: Secondary | ICD-10-CM | POA: Diagnosis not present

## 2022-11-02 ENCOUNTER — Other Ambulatory Visit: Payer: Self-pay | Admitting: Family Medicine

## 2022-11-02 DIAGNOSIS — R928 Other abnormal and inconclusive findings on diagnostic imaging of breast: Secondary | ICD-10-CM

## 2022-11-09 ENCOUNTER — Ambulatory Visit (INDEPENDENT_AMBULATORY_CARE_PROVIDER_SITE_OTHER): Payer: Medicare HMO | Admitting: Clinical

## 2022-11-09 DIAGNOSIS — F4321 Adjustment disorder with depressed mood: Secondary | ICD-10-CM

## 2022-11-09 NOTE — Progress Notes (Signed)
                Ngozi Alvidrez, LCSW 

## 2022-11-09 NOTE — Progress Notes (Signed)
[facility] Counselor/Therapist Progress Note  Patient ID: Lauren Elliott, MRN: [id number],    Date: 11/09/2022  Time Spent: 9:35am -  10:26am : 51 minutes  Treatment Type: Individual Therapy  Reported Symptoms: Patient reported decreased mood and difficulty staying asleep  Mental Status Exam: Appearance:  Well Groomed     Behavior: Appropriate  Motor: Normal  Speech/Language:  Clear and Coherent  Affect: Appropriate  Mood: Patient reported decreased mood  Thought process: normal  Thought content:   WNL  Sensory/Perceptual disturbances:   WNL  Orientation: oriented to person, place, and situation  Attention: Good  Concentration: Good  Memory: WNL  Fund of knowledge:  Good  Insight:   Good  Judgment:  Good  Impulse Control: Good   Risk Assessment: Danger to Self:  No Patient denied current suicidal ideation Self-injurious Behavior: No Danger to Others: No Patient denied current homicidal ideation Duty to Warn:no Physical Aggression / Violence:No  Access to Firearms a concern: No  Gang Involvement:No   Subjective: Patient stated, "its been crazy" since last session. Patient reported she recently attended a multiple day metaphysical fair and met some new people. 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 Patient stated, "I keep forgetting to count my steps". Patient reported she has been walking around local stores and she and her significant other have discussed going to several local parks to walk. Patient stated, "we're making baby steps and I think its going to come to fruition". Patient reported she has a silver sneakers plan and has considered going to one of the recreation centers to walk on indoor track. Patient reported she likes to swim. Patient reported recent stressors have impacted her mood  and triggered a decline in mood. Patient reported she doesn't look forward to the holidays due to family not being around, but reported helping her significant other put up holiday lights is beneficial to her mood. 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 Patient reported she is not sleeping well and has been utilizing an over the counter sleep aid for sleep.   Interventions: Cognitive Behavioral Therapy. Clinician conducted session via WebEx video from clinician's home office. Patient consented to telehealth session and participated in session from patient's home. Discussed patient's thoughts/feelings about a new opportunity to teach a class, the benefits of teaching the class, and strategies she can utilize while waiting for class participants, such as, reading, puzzles, taking a walk. Reviewed strategies discussed during previous session to increase daily exercise and explored additional strategies to increase physical activity, such as, swimming, silver sneakers opportunities. Discussed recent stressors, the impact on patient's mood, and strategies to improve mood.    Diagnosis:  Adjustment Disorder with depressed mood     Plan: Patient is to utilize [employer] Therapy, mindfulness, coping strategies, relaxation techniques, and develop healthy sleep habits to decrease symptoms associated with Adjustment Disorder with depressed mood.    Frequency: Weekly  Modality: individual    Long-term goal:   Patient stated "peace of mind" as a long term goal.     Reduce depressive symptoms as evidence by reduction in depressed mood, decreased appetite, difficulty falling and staying asleep, crying, and irritability from 3 days per week to 0 days per week. Target date of 08/29/23  Short-term goal:  Develop a strategy to re-establish communication with patient's daughter. Target date of 02/26/23   Examine patient's  sleep pattern/routine to develop strategies to improve sleep habits. Target date of 02/26/23   Develop an understanding of the connection between patient's feelings of depressed mood, decreased appetite, difficulty falling asleep and staying asleep, crying, irritability and the impact on patient's thoughts and behaviors to develop coping strategies to utilize in response to stressors. Target date of 02/26/23   11/09/22 Patient continues to progress towards long and short term goals.     Katherina Right, LCSW

## 2022-11-13 ENCOUNTER — Ambulatory Visit
Admission: RE | Admit: 2022-11-13 | Discharge: 2022-11-13 | Disposition: A | Discharge status: Home / Self Care | Payer: Medicare HMO | Source: Ambulatory Visit | Attending: Family Medicine | Admitting: Family Medicine

## 2022-11-13 DIAGNOSIS — R928 Other abnormal and inconclusive findings on diagnostic imaging of breast: Secondary | ICD-10-CM

## 2022-11-13 DIAGNOSIS — N6489 Other specified disorders of breast: Secondary | ICD-10-CM | POA: Diagnosis not present

## 2022-11-23 ENCOUNTER — Ambulatory Visit (INDEPENDENT_AMBULATORY_CARE_PROVIDER_SITE_OTHER): Payer: Medicare HMO | Admitting: Clinical

## 2022-11-23 DIAGNOSIS — F4321 Adjustment disorder with depressed mood: Secondary | ICD-10-CM | POA: Diagnosis not present

## 2022-11-23 NOTE — Progress Notes (Signed)
[facility] Counselor/Therapist Progress Note  Patient ID: Lauren Elliott, MRN: [id number],    Date: 11/23/2022  Time Spent: 9:30am - 10:17am : 47 minutes  Treatment Type: Individual Therapy  Reported Symptoms: Patient reported recent improvement in mood  Mental Status Exam: Appearance:  Well Groomed     Behavior: Appropriate  Motor: Normal  Speech/Language:  Clear and Coherent  Affect: Appropriate  Mood: normal  Thought process: normal  Thought content:   WNL  Sensory/Perceptual disturbances:   WNL  Orientation: oriented to person, place, and situation  Attention: Good  Concentration: Good  Memory: WNL  Fund of knowledge:  Good  Insight:   Good  Judgment:  Good  Impulse Control: Good   Risk Assessment: Danger to Self:  No Patient denied current suicidal ideation Self-injurious Behavior: No Danger to Others: No Patient denied current homicidal ideation Duty to Warn:no Physical Aggression / Violence:No  Access to Firearms a concern: No  Gang Involvement:No   Subjective: Patient reported feeling, "pretty good,  a little tired" due to waking up in the middle of night from a dream. Patient stated, "things have been pretty good" since last session. Patient [place] and enjoyed the day. Patient reported she called her children and friends after returning home from the picnic. Patient reported she had a good friendsgiving, but was upset others didn't contribute monetarily to the meal. 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 Patient stated, "theres some things to look forward to coming up" and reported her stepson will be visiting soon. Patient reported she has been considering writing a letter to her daughter, but reported she doesn't know if her daughter will give patient her address. Patient stated, "that gets my feelings off my chest" in  regards to writing her daughter a letter. Patient reported she is looking forward to putting up Christmas lights with her significant other and enjoys driving to look at [employer]. Patient reported she enjoys trimming her plants and stated, "anything I can do to be outside".   Interventions: Cognitive Behavioral Therapy. Clinician conducted session via WebEx video from clinician's home office. Patient consented to telehealth session and participated in session from patient's home. Discussed recent improvement in patient's mood since last session and contributing factors to recent change in mood. Explored and identified additional coping strategies to increase patient's mood during this time of year. Explored ways to communicate with her daughter, such as, asking her other daughter if she is comfortable mailing a letter to patient's other daughter.   Diagnosis:  Adjustment Disorder with depressed mood     Plan: Patient is to utilize [employer] Therapy, mindfulness, coping strategies, relaxation techniques, and develop healthy sleep habits to decrease symptoms associated with Adjustment Disorder with depressed mood.    Frequency: Weekly  Modality: individual    Long-term goal:   Patient stated "peace of mind" as a long term goal.     Reduce depressive symptoms as evidence by reduction in depressed mood, decreased appetite, difficulty falling and staying asleep, crying, and irritability from 3 days per week to 0 days per week. Target date of 08/29/23   Short-term goal:  Develop a strategy to re-establish communication with patient's daughter. Target date of 02/26/23   Examine patient's sleep pattern/routine to develop strategies to improve sleep habits. Target date of 02/26/23   Develop an understanding of the connection between  patient's feelings of depressed mood, decreased appetite, difficulty falling asleep and staying asleep, crying, irritability and the impact on patient's  thoughts and behaviors to develop coping strategies to utilize in response to stressors. Target date of 02/26/23   11/23/22 Patient continues to progress towards long and short term goals.     Doree Barthel, LCSW

## 2022-11-23 NOTE — Progress Notes (Signed)
                Carlie Corpus, LCSW 

## 2022-12-07 ENCOUNTER — Ambulatory Visit (INDEPENDENT_AMBULATORY_CARE_PROVIDER_SITE_OTHER): Payer: Medicare HMO | Admitting: Clinical

## 2022-12-07 DIAGNOSIS — F4321 Adjustment disorder with depressed mood: Secondary | ICD-10-CM

## 2022-12-07 NOTE — Progress Notes (Signed)
[facility] Counselor/Therapist Progress Note  Patient ID: Lauren Elliott, MRN: [id number],    Date: 12/07/2022  Time Spent: 1:32pm - 2:26pm : 54 minutes   Treatment Type: Individual Therapy  Reported Symptoms: patient stated, "I think I'm pretty good" in response to current mood.   Mental Status Exam: Appearance:  Well Groomed     Behavior: Appropriate  Motor: Normal  Speech/Language:  Clear and Coherent  Affect: Appropriate  Mood: normal  Thought process: normal  Thought content:   WNL  Sensory/Perceptual disturbances:   WNL  Orientation: oriented to person, place, and situation  Attention: Good  Concentration: Good  Memory: WNL  Fund of knowledge:  Good  Insight:   Good  Judgment:  Good  Impulse Control: Good   Risk Assessment: Danger to Self:  No Patient denied current suicidal ideation Self-injurious Behavior: No Danger to Others: No Patient denied current homicidal ideation Duty to Warn:no Physical Aggression / Violence:No  Access to Firearms a concern: No  Gang Involvement:No   Subjective: Patient reported she has been putting up lights on the house. Patient stated, "it worked out nice" in regards to decorating the house with lights and reported decorating was beneficial to her mood. 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 Patient reported a recent conflict amongst congregation members regarding access to the [employer] and patient reported feeling angry in response. Patient reported she plans to contact one of the members to inquire about how to access the [employer] account. Patient reported her initial reaction to the conflict was to talk to her significant other about her feelings. Patient stated, "I'm very proud of myself" in response to her upcoming presentation. Patient stated, "Ernie was my sounding board" in response to the conflict. Patient reported she would like to make more time for  herself to read and color. Patient stated,  "there always seems to be something going on" in response to barriers to self care. Patient reported she is open to setting aside time to read/color while waiting for laundry to wash/dry. Patient reported she is considering sending her daughter some slippers she made and asking her other daughter for her daughter's address. Patient reported she found a phone number for her daughter and is considering calling her daughter.  Patient reported she has considered sending a letter with the slippers if her daughter will provide her with the address. Patient reported awareness that her daughter could respond in a variety of ways to patient reaching out via letter or phone.   Interventions: Cognitive Behavioral Therapy. Clinician conducted session via WebEx video from clinician's home office. Patient consented to telehealth session and participated in session from patient's home. Discussed efficacy of coping strategies identified during previous session to increase patient's mood during this time of year. Discussed recent conflict amongst congregation members and patient's response to the conflict. Explored and identified barriers that prevent patient from making time for herself and strategies to increase time for herself, such as, reading/coloring in between loads of laundry. Processed patient's thoughts/feelings in regards to various methods of contacting her daughter and discussed patient being prepared for her daughter's potential responses. Discussed practicing patient's response to her daughter's potential reactions.   Diagnosis:  Adjustment Disorder with depressed mood     Plan: Patient is to utilize [employer] Therapy, mindfulness, coping strategies, relaxation techniques, and develop healthy sleep habits to decrease symptoms associated with Adjustment Disorder with depressed mood.    Frequency: Weekly  Modality: individual    Long-term goal:    Patient stated "peace of mind" as a long term goal.     Reduce depressive symptoms as evidence by reduction in depressed mood, decreased appetite, difficulty falling and staying asleep, crying, and irritability from 3 days per week to 0 days per week. Target date of 08/29/23   Short-term goal:  Develop a strategy to re-establish communication with patient's daughter. Target date of 02/26/23   Examine patient's sleep pattern/routine to develop strategies to improve sleep habits. Target date of 02/26/23   Develop an understanding of the connection between patient's feelings of depressed mood, decreased appetite, difficulty falling asleep and staying asleep, crying, irritability and the impact on patient's thoughts and behaviors to develop coping strategies to utilize in response to stressors. Target date of 02/26/23   11/23/22 Patient continues to progress towards long and short term goals.     Doree Barthel, LCSW

## 2022-12-07 NOTE — Progress Notes (Signed)
                Yarden Manuelito, LCSW 

## 2023-01-04 ENCOUNTER — Ambulatory Visit: Payer: Medicare HMO | Admitting: Clinical

## 2023-01-14 ENCOUNTER — Ambulatory Visit (INDEPENDENT_AMBULATORY_CARE_PROVIDER_SITE_OTHER): Payer: Medicare HMO | Admitting: Clinical

## 2023-01-14 DIAGNOSIS — F4321 Adjustment disorder with depressed mood: Secondary | ICD-10-CM

## 2023-01-14 NOTE — Progress Notes (Signed)
                Katherina Right, LCSW

## 2023-01-14 NOTE — Progress Notes (Signed)
[facility] Counselor/Therapist Progress Note  Patient ID: Lauren Elliott, MRN: [id number],    Date: 01/14/2023  Time Spent: 2:32pm - 3:13pm : 41 minutes  Treatment Type: Individual Therapy  Reported Symptoms: Patient reported difficulty sleeping.   Mental Status Exam: Appearance:  Well Groomed     Behavior: Appropriate  Motor: Normal  Speech/Language:  Clear and Coherent  Affect: Appropriate  Mood: normal  Thought process: normal  Thought content:   WNL  Sensory/Perceptual disturbances:   WNL  Orientation: oriented to person, place, and situation  Attention: Good  Concentration: Good  Memory: WNL  Fund of knowledge:  Good  Insight:   Good  Judgment:  Good  Impulse Control: Good   Risk Assessment: Danger to Self:  No Patient denied current suicidal ideation Self-injurious Behavior: No Danger to Others: No Patient denied current homicidal ideation Duty to Warn:no Physical Aggression / Violence:No  Access to Firearms a concern: No  Gang Involvement:No   Subjective: Patient stated, "I guess ok" and stated, "I'm tired".  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 Patient reported difficulty sleeping and reported feeling tired as a result. Patient reported she has been taking a medication to help with sleep. Patient reported difficulty returning to sleep after waking up in the middle of the night to go to the restroom. Patient reported she drinks liquids throughout the day and prior to going to bed. Patient reported thoughts prevent patient from returning to sleep at times. Patient reported she drinks coffee in the mornings but not during the day or in the evenings. Patient reported changes in mood and increased irritability in response to decrease in sleep.    Interventions: Cognitive Behavioral Therapy. Clinician conducted session via WebEx video from clinician's home office. Patient consented to telehealth session and participated in session  from patient's home. Discussed recent changes in patient's sleep. Explored potential triggers for decrease in sleep and identified patient's current sleep habits. Provided psycho education related sleep hygiene and strategies to improve sleep, such as, using mediation, increasing physical activity, reading, coloring, or completing jigsaw puzzle to relax. Provided psycho education related to visualization  and progressive muscle relaxation. Discussed patient talking with her physician about frequent urination at night.    Diagnosis:  Adjustment Disorder with depressed mood     Plan: Patient is to utilize [employer] Therapy, mindfulness, coping strategies, relaxation techniques, and develop healthy sleep habits to decrease symptoms associated with Adjustment Disorder with depressed mood.    Frequency: Weekly  Modality: individual    Long-term goal:   Patient stated "peace of mind" as a long term goal.     Reduce depressive symptoms as evidence by reduction in depressed mood, decreased appetite, difficulty falling and staying asleep, crying, and irritability from 3 days per week to 0 days per week. Target date of 08/29/23   Short-term goal:  Develop a strategy to re-establish communication with patient's daughter. Target date of 02/26/23   Examine patient's sleep pattern/routine to develop strategies to improve sleep habits. Target date of 02/26/23   Develop an understanding of the connection between patient's feelings of depressed mood, decreased appetite, difficulty falling asleep and staying asleep, crying, irritability and the impact on patient's thoughts and behaviors to develop coping strategies to utilize in response to stressors. Target date of 02/26/23   11/23/22 Patient continues to progress towards long and short term goals.   Katherina Right, LCSW

## 2023-02-04 ENCOUNTER — Ambulatory Visit: Payer: Medicare HMO | Admitting: Clinical

## 2023-03-11 ENCOUNTER — Ambulatory Visit (INDEPENDENT_AMBULATORY_CARE_PROVIDER_SITE_OTHER): Payer: Medicare HMO | Admitting: Clinical

## 2023-03-11 DIAGNOSIS — F4321 Adjustment disorder with depressed mood: Secondary | ICD-10-CM

## 2023-03-11 NOTE — Progress Notes (Signed)
                Timarion Agcaoili, LCSW 

## 2023-03-11 NOTE — Progress Notes (Signed)
[facility] Counselor/Therapist Progress Note  Patient ID: Lauren Elliott, MRN: [id number],    Date: 03/11/2023  Time Spent: 12:39pm - 1:24pm : 45 minutes  Treatment Type: Individual Therapy  Reported Symptoms: Patient reported recent improvement in sleep.   Mental Status Exam: Appearance:  Well Groomed     Behavior: Appropriate  Motor: Normal  Speech/Language:  Clear and Coherent  Affect: Appropriate  Mood: normal  Thought process: normal  Thought content:   WNL  Sensory/Perceptual disturbances:   WNL  Orientation: oriented to person, place, and situation  Attention: Good  Concentration: Good  Memory: WNL  Fund of knowledge:  Good  Insight:   Good  Judgment:  Good  Impulse Control: Good   Risk Assessment: Danger to Self:  No Patient denied current suicidal ideation  Self-injurious Behavior: No Danger to Others: No Patient denied current homicidal ideation  Duty to Warn:no Physical Aggression / Violence:No  Access to Firearms a concern: No  Gang Involvement:No   Subjective: Patient reported no changes since last session. Patient stated, "pretty good" since last session. Patient reported upcoming annual home inspection is taking place in April and reported the upcoming inspection is not a stressor.  Patient stated, "pretty good" in response to patient's mood since last session. Patient reported recent improvement in sleep. Patient reported she is looking forward to working outside and gardening this spring. Patient reported she started drawing again, has increased her time spent reading, completing word puzzles, and jigsaw puzzles. Patient reported she continues to be active in her church. Patient reported she enrolled in an online drawing class and is enjoying the class. Patient stated, "I think its a good thing" in response to implementation of self care strategies. Patient reported plans to start preparing to attend flea markets which she enjoys.    Interventions: Cognitive Behavioral Therapy. Clinician conducted session via WebEx video from clinician's home office. Patient consented to telehealth session and participated in session from patient's home. Patient reported she is the only one present during session. Reviewed events since last session. Assessed mood. Discussed self care strategies patient has implemented since last session and the impact on patient's mood. Validated patient's implementation of self care strategies.    Diagnosis:  Adjustment Disorder with depressed mood     Plan: Patient is to utilize [employer] Therapy, mindfulness, coping strategies, relaxation techniques, and develop healthy sleep habits to decrease symptoms associated with Adjustment Disorder with depressed mood.    Frequency: Weekly  Modality: individual    Long-term goal:   Patient stated "peace of mind" as a long term goal.     Reduce depressive symptoms as evidence by reduction in depressed mood, decreased appetite, difficulty falling and staying asleep, crying, and irritability from 3 days per week to 0 days per week. Target date of 08/29/23   Short-term goal:  Develop a strategy to re-establish communication with patient's daughter. Target date of 02/26/23   Examine patient's sleep pattern/routine to develop strategies to improve sleep habits. Target date of 02/26/23   Develop an understanding of the connection between patient's feelings of depressed mood, decreased appetite, difficulty falling asleep and staying asleep, crying, irritability and the impact on patient's thoughts and behaviors to develop coping strategies to utilize in response to stressors. Target date of 02/26/23   11/23/22 Patient continues to progress towards long and short term goals.     Katherina Right, LCSW

## 2023-04-01 ENCOUNTER — Ambulatory Visit (INDEPENDENT_AMBULATORY_CARE_PROVIDER_SITE_OTHER): Payer: Medicare HMO | Admitting: Clinical

## 2023-04-01 DIAGNOSIS — F4321 Adjustment disorder with depressed mood: Secondary | ICD-10-CM

## 2023-04-01 NOTE — Progress Notes (Signed)
                Stormie Ventola, LCSW 

## 2023-04-01 NOTE — Progress Notes (Signed)
[facility] Counselor/Therapist Progress Note  Patient ID: Lauren Elliott, MRN: [id number],    Date: 04/01/2023  Time Spent: 10:30am - 11:30am : 60 minutes   Treatment Type: Individual Therapy  Reported Symptoms: Patient reported improvement in depressive symptoms and reported no current symptoms.   Mental Status Exam: Appearance:  Well Groomed     Behavior: Appropriate  Motor: Normal  Speech/Language:  Clear and Coherent  Affect: Appropriate  Mood: normal  Thought process: normal  Thought content:   WNL  Sensory/Perceptual disturbances:   WNL  Orientation: oriented to person, place, and situation  Attention: Good  Concentration: Good  Memory: WNL  Fund of knowledge:  Good  Insight:   Good  Judgment:  Good  Impulse Control: Good   Risk Assessment: Danger to Self:  No Patient denied current suicidal ideation  Self-injurious Behavior: No Danger to Others: No Patient denied current homicidal ideation  Duty to Warn:no Physical Aggression / Violence:No  Access to Firearms a concern: No  Gang Involvement:No   Subjective: Patient stated, "I am suffering from stress fatigue". Patient reported Saturday was her birthday and they spent time in the emergency room due to her significant other's medical needs. Patient reported she was worried about her significant other during that time. Patient reported fatigue as a result of recent stressor. Patient reported she has been gardening and reported gardening is beneficial to her mood. Patient reported when gardening her mood is a 10 on a scale of 1 to 10 (10 being the best). Patient reported she is excited about going to [place] to explore the area. Patient reported she and her significant other have gone on several walks since last session. Patient reported they have a fishing trip planned this week. Patient reported they have worked on a puzzle and patient has been reading since last session. Patient reported she has not  reached out to her daughter at this time. Patient reported improvement in sleep and reported no difficulty sleeping at this time. Patient stated, "I think I'm doing pretty well" in response to short term goal #3. Patient stated, "I think I'm doing good" in response to patient's long term goal.   Interventions: Cognitive Behavioral Therapy. Clinician conducted session via WebEx video from clinician's home office. Patient consented to telehealth session and participated in session from patient's home. Reviewed events since last session. Discussed and clarified patient's statement, "I am suffering from stress fatigue". Discussed recent stressor, explored and identified the impact on patient's physical and mental health. Reviewed self care strategies patient has implemented and the outcome.  Validated patient's continued implementation of self care strategies. Reviewed patient's goals and progress.    Diagnosis:  Adjustment Disorder with depressed mood     Plan: Patient is to utilize [employer] Therapy, mindfulness, coping strategies, relaxation techniques, and develop healthy sleep habits to decrease symptoms associated with Adjustment Disorder with depressed mood.    Frequency: Weekly  Modality: individual    Long-term goal:   Patient stated "peace of mind" as a long term goal.     Reduce depressive symptoms as evidence by reduction in depressed mood, decreased appetite, difficulty falling and staying asleep, crying, and irritability from 3 days per week to 0 days per week. Target date of 08/29/23. Per patient on 04/01/23 she feels this goal has been met at this time.    Short-term goal:  Develop a strategy to re-establish communication with patient's daughter. Target date of 10/01/23   Examine patient's sleep pattern/routine to develop  strategies to improve sleep habits. Target date of 02/26/23. Per patient on 04/01/23 she feels this goal has been met at this time.    Develop an understanding  of the connection between patient's feelings of depressed mood, decreased appetite, difficulty falling asleep and staying asleep, crying, irritability and the impact on patient's thoughts and behaviors to develop coping strategies to utilize in response to stressors. Target date of 02/26/23. Per patient on 04/01/23 she feels this goal has been met at this time.       Doree Barthel, LCSW

## 2023-04-05 DIAGNOSIS — M8580 Other specified disorders of bone density and structure, unspecified site: Secondary | ICD-10-CM | POA: Diagnosis not present

## 2023-04-05 DIAGNOSIS — I10 Essential (primary) hypertension: Secondary | ICD-10-CM | POA: Diagnosis not present

## 2023-04-05 DIAGNOSIS — J449 Chronic obstructive pulmonary disease, unspecified: Secondary | ICD-10-CM | POA: Diagnosis not present

## 2023-04-05 DIAGNOSIS — N390 Urinary tract infection, site not specified: Secondary | ICD-10-CM | POA: Diagnosis not present

## 2023-04-05 DIAGNOSIS — R300 Dysuria: Secondary | ICD-10-CM | POA: Diagnosis not present

## 2023-04-05 DIAGNOSIS — I700 Atherosclerosis of aorta: Secondary | ICD-10-CM | POA: Diagnosis not present

## 2023-04-05 DIAGNOSIS — K582 Mixed irritable bowel syndrome: Secondary | ICD-10-CM | POA: Diagnosis not present

## 2023-04-19 ENCOUNTER — Ambulatory Visit (INDEPENDENT_AMBULATORY_CARE_PROVIDER_SITE_OTHER): Payer: Medicare HMO | Admitting: Clinical

## 2023-04-19 DIAGNOSIS — F4321 Adjustment disorder with depressed mood: Secondary | ICD-10-CM

## 2023-04-19 NOTE — Progress Notes (Signed)
[facility] Counselor/Therapist Progress Note  Patient ID: Lauren Elliott, MRN: [id number],    Date: 04/19/2023  Time Spent: 1:32pm - 2:20pm : 48 minutes  Treatment Type: Individual Therapy  Reported Symptoms: Patient reported sadness today  Mental Status Exam: Appearance:  Well Groomed     Behavior: Appropriate  Motor: Normal  Speech/Language:  Clear and Coherent  Affect: Appropriate  Mood: sad  Thought process: normal  Thought content:   WNL  Sensory/Perceptual disturbances:   WNL  Orientation: oriented to person, place, and situation  Attention: Good  Concentration: Good  Memory: WNL  Fund of knowledge:  Good  Insight:   Good  Judgment:  Good  Impulse Control: Good   Risk Assessment: Danger to Self:  No Patient denied current suicidal ideation  Self-injurious Behavior: No Danger to Others: No Patient denied current homicidal ideation  Duty to Warn:no Physical Aggression / Violence:No  Access to Firearms a concern: No  Gang Involvement:No   Subjective: Patient stated, "ok" in response to stress fatigue reported during last session. Patient reported significant other has not had any additional nose bleeds since last session. Patient reported sadness today due to the loss of two of her house wrens. Patient reported one wren was caught in a piece of fishing line the [place] was using in its nest. Patient stated, "its very sad and I'm pissed" in response to the wren's death. Patient reported she is excited about attending the first flea market of the season tomorrow. Patient reported she has been gardening and stated, "flowers and veggies are growing, so I'm happy about that". Patient reported she has been reading and painting since last session. Patient reported her mood has been "good". Patient stated, "I can't think of anything" in response to new goals for therapy. Patient reported she spoke with her daughter about patient's plan to write her other daughter a  letter. Patient reported her daughter is going to ask her other daughter if she will agree to accept patient's letter. Patient reported she feels she needs to write the letter to her daughter before receiving her daughter's response.    Interventions: Cognitive Behavioral Therapy, Motivational Interviewing, and supportive therapy . Clinician conducted session via caregility video from clinician's home office. Patient consented to telehealth session and participated in session from patient's home. Reviewed events since last session. Discussed the status of stress fatigue patient reported during previous session and additional stressors. Provided supportive therapy, active listening, and validation as patient discussed the recent loss of two of her wrens and patient's response to the situation. Explored and identified activities that have been beneficial to patient's mood since last session. Explored new goals for therapy. Discussed patient's recent communication with her daughter and the status of patient writing a letter to her daughter.   Collaboration of Care: Other Patient requested to complete a consent for her primary care provider, Meridee Score with [facility] and requested to complete a consent for her daughter.    Patient/Guardian was advised Release of Information must be obtained prior to any record release in order to collaborate their care with an outside provider. Patient/Guardian was advised if they have not already done so to contact [facility] to sign all necessary forms in order for [place] to release information regarding their care.   Consent: Patient/Guardian gives verbal consent for treatment and assignment of benefits for services provided during this visit. Patient/Guardian expressed understanding and agreed to proceed.     Diagnosis:  Adjustment  Disorder with depressed mood     Plan: Patient is to utilize [employer] Therapy, mindfulness,  coping strategies, relaxation techniques, and develop healthy sleep habits to decrease symptoms associated with Adjustment Disorder with depressed mood.    Frequency: Weekly  Modality: individual    Long-term goal:   Patient stated "peace of mind" as a long term goal.     Reduce depressive symptoms as evidence by reduction in depressed mood, decreased appetite, difficulty falling and staying asleep, crying, and irritability from 3 days per week to 0 days per week. Target date of 08/29/23. Per patient on 04/01/23 she feels this goal has been met at this time.    Short-term goal:  Develop a strategy to re-establish communication with patient's daughter. Target date of 10/01/23   Examine patient's sleep pattern/routine to develop strategies to improve sleep habits. Target date of 02/26/23. Per patient on 04/01/23 she feels this goal has been met at this time.    Develop an understanding of the connection between patient's feelings of depressed mood, decreased appetite, difficulty falling asleep and staying asleep, crying, irritability and the impact on patient's thoughts and behaviors to develop coping strategies to utilize in response to stressors. Target date of 02/26/23. Per patient on 04/01/23 she feels this goal has been met at this time.   Doree Barthel, LCSW

## 2023-04-19 NOTE — Progress Notes (Signed)
                Nitya Cauthon, LCSW 

## 2023-05-03 ENCOUNTER — Ambulatory Visit (INDEPENDENT_AMBULATORY_CARE_PROVIDER_SITE_OTHER): Payer: Medicare HMO | Admitting: Clinical

## 2023-05-03 DIAGNOSIS — F4321 Adjustment disorder with depressed mood: Secondary | ICD-10-CM | POA: Diagnosis not present

## 2023-05-03 NOTE — Progress Notes (Signed)
[facility] Counselor/Therapist Progress Note  Patient ID: Lauren Elliott, MRN: [id number],    Date: 05/03/2023  Time Spent: 1:32pm - 2:20pm : 48 minutes   Treatment Type: Individual Therapy  Reported Symptoms: no current symptoms reported  Mental Status Exam: Appearance:  Well Groomed     Behavior: Appropriate  Motor: Normal  Speech/Language:  Clear and Coherent  Affect: Appropriate  Mood: normal  Thought process: normal  Thought content:   WNL  Sensory/Perceptual disturbances:   WNL  Orientation: oriented to person, place, and situation  Attention: Good  Concentration: Good  Memory: WNL  Fund of knowledge:  Good  Insight:   Good  Judgment:  Good  Impulse Control: Good   Risk Assessment: Danger to Self:  No Patient denied current suicidal ideation  Self-injurious Behavior: No Danger to Others: No Patient denied current homicidal ideation Duty to Warn:no Physical Aggression / Violence:No  Access to Firearms a concern: No  Gang Involvement:No   Subjective: Patient reported she has been gardening, painting, drawing, and attending yard sales which benefits her mood. Patient reported she is concerned about her significant other due to recent medical symptoms. Patient stated, "ok" in response to mood since last session. Patient reported one night she woke up and felt depressed. Patient reported she was able to return to sleep after waking up and reported feelings of depressed mood occurred only once. Patient reported no additional feelings of depressed mood. Patient reported no triggers associated with depressed mood. Patient stated, "its pretty good" in response to current mood. Patient stated, "I thought about it", "I didn't get very far" in response to writing letter to her daughter. Patient reported she plans to ask her daughter on Sunday the outcome of her conversation with her other daughter. Patient stated, "it will make made me sad, but I will respect her  wishes" if daughter is not willing to receive letter from patient. Patient reported she would feel happy if her daughter is open to patient sending her a letter.   Interventions: Cognitive Behavioral Therapy and Interpersonal. Clinician conducted session via caregility video from clinician's home office. Patient consented to telehealth session and participated in session from patient's home. Reviewed events since last session. Assessed patient's mood. Discussed activities patient has participated in that continue to be beneficial to patient's mood. Explored triggers for recent decline in mood. Reviewed patient's homework and explored barriers to patient beginning letter to daughter. Processed patient's thoughts/feelings in regards to  potential responses from her daughter regarding patient sending daughter letter.   Collaboration of Care: Other not required at this time  Consent: Patient/Guardian gives verbal consent for treatment and assignment of benefits for services provided during this visit. Patient/Guardian expressed understanding and agreed to proceed.     Diagnosis:  Adjustment Disorder with depressed mood     Plan: Patient is to utilize [employer] Therapy, mindfulness, coping strategies, relaxation techniques, and develop healthy sleep habits to decrease symptoms associated with Adjustment Disorder with depressed mood.    Frequency: Weekly  Modality: individual    Long-term goal:   Patient stated "peace of mind" as a long term goal.     Reduce depressive symptoms as evidence by reduction in depressed mood, decreased appetite, difficulty falling and staying asleep, crying, and irritability from 3 days per week to 0 days per week. Target date of 08/29/23. Per patient on 04/01/23 she feels this goal has been met at this time.    Short-term goal:  Develop a strategy to  re-establish communication with patient's daughter. Target date of 10/01/23   Examine patient's sleep  pattern/routine to develop strategies to improve sleep habits. Target date of 02/26/23. Per patient on 04/01/23 she feels this goal has been met at this time.    Develop an understanding of the connection between patient's feelings of depressed mood, decreased appetite, difficulty falling asleep and staying asleep, crying, irritability and the impact on patient's thoughts and behaviors to develop coping strategies to utilize in response to stressors. Target date of 02/26/23. Per patient on 04/01/23 she feels this goal has been met at this time.     Doree Barthel, LCSW

## 2023-05-03 NOTE — Progress Notes (Signed)
                Aireana Ryland, LCSW 

## 2023-05-27 ENCOUNTER — Ambulatory Visit (INDEPENDENT_AMBULATORY_CARE_PROVIDER_SITE_OTHER): Payer: Medicare HMO | Admitting: Clinical

## 2023-05-27 DIAGNOSIS — F4321 Adjustment disorder with depressed mood: Secondary | ICD-10-CM

## 2023-05-27 NOTE — Progress Notes (Signed)
[facility] Counselor/Therapist Progress Note  Patient ID: Lauren Elliott, MRN: [id number],    Date: 05/27/2023  Time Spent: 12:35pm - 1:26pm : 51 minutes   Treatment Type: Individual Therapy  Reported Symptoms: Patient reported a recent increase in sleep and fatigue.   Mental Status Exam: Appearance:  Neat and Well Groomed     Behavior: Appropriate  Motor: Normal  Speech/Language:  Clear and Coherent  Affect: Appropriate  Mood: normal  Thought process: normal  Thought content:   WNL  Sensory/Perceptual disturbances:   WNL  Orientation: oriented to person, place, and situation  Attention: Good  Concentration: Good  Memory: WNL  Fund of knowledge:  Good  Insight:   Good  Judgment:  Good  Impulse Control: Good   Risk Assessment: Danger to Self:  No Patient denied current suicidal ideation  Self-injurious Behavior: No Danger to Others: No Patient denied current homicidal ideation Duty to Warn:no Physical Aggression / Violence:No  Access to Firearms a concern: No  Gang Involvement:No   Subjective: Patient stated, "alright" in response to events since last session.  Patient stated, "I was kind of fighting a sinus thing" and reported patient's significant other is recovering from pneumonia. Patient stated, "I've been sleeping a lot" and reported feeling tired frequently. Patient stated, "the same" in response to mood since last session.  Patient reported she has been gardening, cleaning the back yard and patio, and attending flea markets. Patient reported no increase in physical activity outside of patient's normal activities.  Patient reported she wrote her daughter a Physicist, medical. Patient reported she initially delayed writing the letter and reported she did not know what she wanted to say or how to approach the subject. Patient stated,  "pretty good" in response to how patient felt after writing the letter. Patient stated, "I'm glad I did it". Patient reported she may  mail the letter to her daughter. Patient stated,  "I guess not knowing how she would respond" in response to barriers to reaching out to her daughter in the past.   Interventions: Cognitive Behavioral Therapy and Interpersonal. Clinician conducted session via caregility video from clinician's home office. Patient consented to telehealth session and participated in session from patient's home. Reviewed events since last session. Discussed status of recent stressors. Assessed patient's mood. Explored triggers for recent fatigue and increase in sleep. Reviewed patient's homework to write a letter to patient's daughter. Discussed patient's thoughts/feelings in response to writing a letter to her daughter. Explored barriers to patient initiating communication with her daughter in the past.    Collaboration of Care: Other not required at this time    Diagnosis:  Adjustment Disorder with depressed mood     Plan: Patient is to utilize Cognitive Behavioral Therapy, mindfulness, coping strategies, relaxation techniques, and develop healthy sleep habits to decrease symptoms associated with Adjustment Disorder with depressed mood.    Frequency: Weekly  Modality: individual    Long-term goal:   Patient stated "peace of mind" as a long term goal.     Reduce depressive symptoms as evidence by reduction in depressed mood, decreased appetite, difficulty falling and staying asleep, crying, and irritability from 3 days per week to 0 days per week. Target date of 08/29/23. Per patient on 04/01/23 she feels this goal has been met at this time.    Short-term goal:  Develop a strategy to re-establish communication with patient's daughter. Target date of 10/01/23   Examine patient's sleep pattern/routine to develop strategies to improve sleep habits. Target  date of 02/26/23. Per patient on 04/01/23 she feels this goal has been met at this time.    Develop an understanding of the connection between patient's feelings of  depressed mood, decreased appetite, difficulty falling asleep and staying asleep, crying, irritability and the impact on patient's thoughts and behaviors to develop coping strategies to utilize in response to stressors. Target date of 02/26/23. Per patient on 04/01/23 she feels this goal has been met at this time.       Doree Barthel, LCSW

## 2023-05-27 NOTE — Progress Notes (Signed)
                Janay Canan, LCSW 

## 2023-06-17 ENCOUNTER — Ambulatory Visit (INDEPENDENT_AMBULATORY_CARE_PROVIDER_SITE_OTHER): Payer: Medicare HMO | Admitting: Clinical

## 2023-06-17 DIAGNOSIS — F4321 Adjustment disorder with depressed mood: Secondary | ICD-10-CM

## 2023-06-17 NOTE — Progress Notes (Signed)
[facility] Counselor/Therapist Progress Note  Patient ID: Lauren Elliott, MRN: [id number],    Date: 06/17/2023  Time Spent: 12:43pm - 1:19pm : 36 minutes  Treatment Type: Individual Therapy  Reported Symptoms: none reported  Mental Status Exam: Appearance:  Well Groomed     Behavior: Appropriate  Motor: Normal  Speech/Language:  Clear and Coherent  Affect: Appropriate  Mood: normal  Thought process: normal  Thought content:   WNL  Sensory/Perceptual disturbances:   WNL  Orientation: oriented to person, place, and situation  Attention: Good  Concentration: Good  Memory: WNL  Fund of knowledge:  Good  Insight:   Good  Judgment:  Good  Impulse Control: Good   Risk Assessment: Danger to Self:  No Patient denied current suicidal ideation  Self-injurious Behavior: No Danger to Others: No Patient denied current homicidal ideation Duty to Warn:no Physical Aggression / Violence:No  Access to Firearms a concern: No  Gang Involvement:No   Subjective: Patient reported she forgot about today's appointment. Patient stated, "pretty good" in response to mood since last session. Patient reported concern about the temperature outside and stated, "Im probably going to go a little stir crazy being cooped up in the house" in response to the heat. Patient reported she can complete jigsaw puzzles and other puzzles, use patient's knitting looms, read, or draw while the temperatures are high outside. Patient reported she has a carport that she can utilize during the cooler parts of the day.  Patient stated, "pretty good" in response to current mood. Patient reported June 23rd is a significant date for her family. Patient reported June 23rd is the date her father passed away, her parents were married, and the day medical staff informed patient her mother was not going to live through the night. Patient stated, "I got through the day just fine" and reported she spoke with her faith  community about the significance of that day. Patient reported historically June 23rd has been a difficult day and triggered emotions in the past.   Interventions: Cognitive Behavioral Therapy. Clinician conducted session via caregility video and telephone audio from clinician's home office due to patient not being able connect using caregility audio. Patient requested her significant other be present when patient initially logged into caregility to assist with audio issues. Patient provided verbal consent to proceed with telehealth session and is aware of limitations of telephone or video visits. Patient participated in session from patient's home. Assessed patient's mood since last session and patient's current mood. Discussed potential trigger for change in patient's mood and explored current coping strategies patient can implement to support her mood. Explored and identified additional activities/coping strategies to support patient's mood during increase in temperatures outside, such as, participating in activities in patient's carport during cooler times of the day. Discussed previous trigger for changes in patient's mood and ways in which patient coped with trigger this year.    Collaboration of Care: Other not required at this time     Diagnosis:  Adjustment Disorder with depressed mood     Plan: Patient is to utilize Cognitive Behavioral Therapy, mindfulness, coping strategies, relaxation techniques, and develop healthy sleep habits to decrease symptoms associated with Adjustment Disorder with depressed mood.    Frequency: monthly  Modality: individual    Long-term goal:   Patient stated "peace of mind" as a long term goal.     Reduce depressive symptoms as evidence by reduction in depressed mood, decreased appetite, difficulty falling and staying asleep, crying, and irritability  from 3 days per week to 0 days per week. Target date of 08/29/23. Per patient on 04/01/23 she feels this goal has  been met at this time.    Short-term goal:  Develop a strategy to re-establish communication with patient's daughter. Target date of 10/01/23   Examine patient's sleep pattern/routine to develop strategies to improve sleep habits. Target date of 02/26/23. Per patient on 04/01/23 she feels this goal has been met at this time.    Develop an understanding of the connection between patient's feelings of depressed mood, decreased appetite, difficulty falling asleep and staying asleep, crying, irritability and the impact on patient's thoughts and behaviors to develop coping strategies to utilize in response to stressors. Target date of 02/26/23. Per patient on 04/01/23 she feels this goal has been met at this time.     Doree Barthel, LCSW

## 2023-06-17 NOTE — Progress Notes (Signed)
                Maleyah Evans, LCSW 

## 2023-07-09 ENCOUNTER — Ambulatory Visit: Payer: 59 | Admitting: Clinical

## 2023-07-09 DIAGNOSIS — F4321 Adjustment disorder with depressed mood: Secondary | ICD-10-CM | POA: Diagnosis not present

## 2023-07-09 NOTE — Progress Notes (Signed)
[facility] Counselor/Therapist Progress Note  Patient ID: Sharonlee Nine, MRN: [id number],    Date: 07/09/2023  Time Spent: 1:44pm - 2:22pm : 38 minutes   Treatment Type: Individual Therapy  Reported Symptoms: Patient reported recent changes in mood  Mental Status Exam: Appearance:  Neat and Well Groomed     Behavior: Appropriate  Motor: Normal  Speech/Language:  Clear and Coherent  Affect: Appropriate  Mood: normal  Thought process: normal  Thought content:   WNL  Sensory/Perceptual disturbances:   WNL  Orientation: oriented to person, place, and situation  Attention: Good  Concentration: Good  Memory: WNL  Fund of knowledge:  Good  Insight:   Good  Judgment:  Good  Impulse Control: Good   Risk Assessment: Danger to Self:  No Patient denied current suicidal ideation  Self-injurious Behavior: No Danger to Others: No Patient denied current homicidal ideation Duty to Warn:no Physical Aggression / Violence:No  Access to Firearms a concern: No  Gang Involvement:No   Subjective: Patient reported she forgot about today's appointment. Patient reported she slept 11 hours last night and reported she feels the heat is impacting patient's mood. Patient reported the lowest she can get the temperature in the home is 81 degrees and reported she plans to contact their landlord. Patient stated, "I think I'm doing pretty well for being cooped up inside" and reported she has been staying inside due to the heat. Patient reported several tree limbs recently fell on their property and damaged some of their belongings. Patient stated, "kind of grumpy" in response to patient's mood recently. Patient reported patient and her significant other have been going for a drive, walking around a local store, and going to [employer]. Patient reported she has been watching videos to learn origami and has been practicing origami. Patient reported she has been coloring and reading as well.  Patient reported she is frustrated with their current home and is planning to move. Patient reported she has been experiencing pain in her hips and reported her ankles are swelling.   Interventions: Cognitive Behavioral Therapy. Clinician conducted session via caregility video from clinician's office at [facility]. Patient provided verbal consent to proceed with telehealth session and is aware of limitations of telephone or video visits. Patient participated in session from patient's home. Discussed today's appointment. Reviewed events since last session. Assessed patient's current mood and mood since last session. Explored and identified triggers for recent decline in patient's mood. Explored and identified strategies patient has implemented to support patient's mental health while being inside during increased temperatures. Discussed patient following up with her physician to discuss hip pain and swelling in patient's ankles.     Collaboration of Care: Other not required at this time     Diagnosis:  Adjustment Disorder with depressed mood     Plan: Patient is to utilize Cognitive Behavioral Therapy, mindfulness, coping strategies, relaxation techniques, and develop healthy sleep habits to decrease symptoms associated with Adjustment Disorder with depressed mood.    Frequency: monthly  Modality: individual    Long-term goal:   Patient stated "peace of mind" as a long term goal.     Reduce depressive symptoms as evidence by reduction in depressed mood, decreased appetite, difficulty falling and staying asleep, crying, and irritability from 3 days per week to 0 days per week. Target date of 08/29/23. Per patient on 04/01/23 she feels this goal has been met at this time.    Short-term goal:  Develop a strategy to  re-establish communication with patient's daughter. Target date of 10/01/23   Examine patient's sleep pattern/routine to develop strategies to improve sleep habits. Target date of  02/26/23. Per patient on 04/01/23 she feels this goal has been met at this time.    Develop an understanding of the connection between patient's feelings of depressed mood, decreased appetite, difficulty falling asleep and staying asleep, crying, irritability and the impact on patient's thoughts and behaviors to develop coping strategies to utilize in response to stressors. Target date of 02/26/23. Per patient on 04/01/23 she feels this goal has been met at this time.       Doree Barthel, LCSW

## 2023-07-09 NOTE — Progress Notes (Signed)
                Karen Sharpe, LCSW 

## 2023-07-25 ENCOUNTER — Ambulatory Visit: Payer: 59 | Admitting: Clinical

## 2023-07-25 DIAGNOSIS — F4321 Adjustment disorder with depressed mood: Secondary | ICD-10-CM | POA: Diagnosis not present

## 2023-07-25 NOTE — Progress Notes (Signed)
[facility] Counselor/Therapist Progress Note  Patient ID: Lauren Elliott, MRN: [id number],    Date: 07/25/2023  Time Spent: 2:37pm - 3:22pm : 45 minutes  Treatment Type: Individual Therapy  Reported Symptoms: Patient reported feeling depressed one evening since last session.  Mental Status Exam: Appearance:  Neat and Well Groomed     Behavior: Appropriate  Motor: Normal  Speech/Language:  Clear and Coherent  Affect: Appropriate  Mood: normal  Thought process: normal  Thought content:   WNL  Sensory/Perceptual disturbances:   WNL  Orientation: oriented to person, place, and situation  Attention: Good  Concentration: Good  Memory: WNL  Fund of knowledge:  Good  Insight:   Good  Judgment:  Good  Impulse Control: Good   Risk Assessment: Danger to Self:  No Patient denied current suicidal ideation  Self-injurious Behavior: No Danger to Others: No Patient denied current homicidal ideation Duty to Warn:no Physical Aggression / Violence:No  Access to Firearms a concern: No  Gang Involvement:No   Subjective: Patient reported she continues to experience pain in her hip. Patient reported she has not discussed the pain in her hip with her PCP. Patient reported her ankle and her feet have been swelling in the afternoons. Patient stated, "not too bad" in response to patient's mood since last session. Patient reported feeling depressed one evening since last session. Patient reported she woke up feeling sad in the middle of the night and the next day her mood returned to normal. Patient stated, "I get a little depressed over our money situation frequently". Patient stated, "I get a little down" and reported  thinking about the finances impacts patient's mood. Patient reported patient/significant other have decided not to renew their lease and plan to move. 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 Patient reported she feels  her mood has been stable since last session.  Patient reported she has not spoken with her daughter in regards to communication with patient's other daughter and reported she has decided to mail the letter to her daughter once she obtains an address for her daughter.   Interventions: Cognitive Behavioral Therapy. Clinician conducted session via caregility video from clinician's office at [facility]. Patient provided verbal consent to proceed with telehealth session and is aware of limitations of telephone or video visits. Patient participated in session from patient's home. Discussed recent changes in patient's health. Discussed patient following up with her physician to discuss hip pain and swelling in patient's ankles/feet.  Assessed patient's mood since last session and patient's current mood. Discussed decline in patient's mood at times. Assisted patient in exploring and identifying triggers for decline in mood.  Discussed the status of patient's communication with her daughter.    Collaboration of Care: Other not required at this time     Diagnosis:  Adjustment Disorder with depressed mood     Plan: Patient is to utilize Cognitive Behavioral Therapy, mindfulness, coping strategies, relaxation techniques, and develop healthy sleep habits to decrease symptoms associated with Adjustment Disorder with depressed mood.    Frequency: monthly  Modality: individual    Long-term goal:   Patient stated "peace of mind" as a long term goal.     Reduce depressive symptoms as evidence by reduction in depressed mood, decreased appetite, difficulty falling and staying asleep, crying, and irritability from 3 days per week to 0 days per week. Target date of 08/29/23. Per patient on 04/01/23 she feels this goal has been met  at this time.    Short-term goal:  Develop a strategy to re-establish communication with patient's daughter. Target date of 10/01/23   Examine patient's sleep pattern/routine to  develop strategies to improve sleep habits. Target date of 02/26/23. Per patient on 04/01/23 she feels this goal has been met at this time.    Develop an understanding of the connection between patient's feelings of depressed mood, decreased appetite, difficulty falling asleep and staying asleep, crying, irritability and the impact on patient's thoughts and behaviors to develop coping strategies to utilize in response to stressors. Target date of 02/26/23. Per patient on 04/01/23 she feels this goal has been met at this time.     Doree Barthel, LCSW

## 2023-07-25 NOTE — Progress Notes (Signed)
                Karen Sharpe, LCSW 

## 2023-08-16 DIAGNOSIS — M25551 Pain in right hip: Secondary | ICD-10-CM | POA: Diagnosis not present

## 2023-08-16 DIAGNOSIS — I10 Essential (primary) hypertension: Secondary | ICD-10-CM | POA: Diagnosis not present

## 2023-08-16 DIAGNOSIS — I700 Atherosclerosis of aorta: Secondary | ICD-10-CM | POA: Diagnosis not present

## 2023-08-16 DIAGNOSIS — M1990 Unspecified osteoarthritis, unspecified site: Secondary | ICD-10-CM | POA: Diagnosis not present

## 2023-08-16 DIAGNOSIS — J449 Chronic obstructive pulmonary disease, unspecified: Secondary | ICD-10-CM | POA: Diagnosis not present

## 2023-08-16 DIAGNOSIS — R040 Epistaxis: Secondary | ICD-10-CM | POA: Diagnosis not present

## 2023-08-29 ENCOUNTER — Ambulatory Visit (INDEPENDENT_AMBULATORY_CARE_PROVIDER_SITE_OTHER): Payer: 59 | Admitting: Clinical

## 2023-08-29 DIAGNOSIS — F4321 Adjustment disorder with depressed mood: Secondary | ICD-10-CM

## 2023-08-29 NOTE — Progress Notes (Signed)
[facility] Counselor/Therapist Progress Note  Patient ID: Lauren Elliott, MRN: [id number],    Date: 08/29/2023  Time Spent: 1:35pm - 2:17pm : 42 minutes   Treatment Type: Individual Therapy  Reported Symptoms: none reported  Mental Status Exam: Appearance:  Neat     Behavior: Appropriate  Motor: Normal  Speech/Language:  Clear and Coherent  Affect: Appropriate  Mood: normal  Thought process: normal  Thought content:   WNL  Sensory/Perceptual disturbances:   WNL  Orientation: oriented to person, place, and situation  Attention: Good  Concentration: Good  Memory: WNL  Fund of knowledge:  Good  Insight:   Good  Judgment:  Good  Impulse Control: Good   Risk Assessment: Danger to Self:  No Patient denied current suicidal ideation  Self-injurious Behavior: No Danger to Others: No Patient denied current homicidal ideation Duty to Warn:no Physical Aggression / Violence:No  Access to Firearms a concern: No  Gang Involvement:No   Subjective: Patient stated, "pretty good" in response to events since last session. Patient stated, "things have been going pretty smooth". Patient reported patient/significant other have been working outside, recently attended a birthday party, and patient reported she recently spoke with her nephew. Patient reported conversations with her nephew have provided patient more information about patient's brother. Patient reported she has not seen her brother since patient was 65 years old. Patient reported she followed up with her PCP to discuss the pain in her hip. Patient reported her PCP advised patient has arthritis and recommended patient walk to improve arthritis. Patient reported she walks around local stores while shopping to increase her activity level. Patient reported she likes to swim but does not like to swim in pools with chlorine. Patient stated, "good" in response to patient's mood since last session. Patient reported she has been  reading while in inside due to weather. Patient reported she has not received a response from her daughter and feels she will need to be the one who initiates outreach to her daughter.   Interventions: Cognitive Behavioral Therapy. Clinician conducted session via caregility video and telephone for audio from clinician's office at [facility]. During session, the telephone was utilized for the audio component of the session due to patient's audio not working properly. Patient provided verbal consent to proceed with telehealth session and is aware of limitations of telephone or video visits. Patient participated in session from patient's home. Reviewed events since last session. Discussed patient's recent conversations with her nephew and patient's thoughts/feelings in response to their conversations. Discussed patient's recent appointment with her PCP and the outcome. Explored strategies to increase patient's activity level. Assessed patient's mood since last session and patient's current mood. Reviewed the status of patient's communication with her daughter.    Collaboration of Care: Other not required at this time     Diagnosis:  Adjustment Disorder with depressed mood     Plan: Patient is to utilize Cognitive Behavioral Therapy, mindfulness, coping strategies, relaxation techniques, and develop healthy sleep habits to decrease symptoms associated with Adjustment Disorder with depressed mood.    Frequency: monthly  Modality: individual    Long-term goal:   Patient stated "peace of mind" as a long term goal.     Reduce depressive symptoms as evidence by reduction in depressed mood, decreased appetite, difficulty falling and staying asleep, crying, and irritability from 3 days per week to 0 days per week. Target date of 08/29/23. Per patient on 04/01/23 she feels this goal has been met at  this time.    Short-term goal:  Develop a strategy to re-establish communication with patient's  daughter. Target date of 10/01/23   Examine patient's sleep pattern/routine to develop strategies to improve sleep habits. Target date of 02/26/23. Per patient on 04/01/23 she feels this goal has been met at this time.    Develop an understanding of the connection between patient's feelings of depressed mood, decreased appetite, difficulty falling asleep and staying asleep, crying, irritability and the impact on patient's thoughts and behaviors to develop coping strategies to utilize in response to stressors. Target date of 02/26/23. Per patient on 04/01/23 she feels this goal has been met at this time.     Doree Barthel, LCSW

## 2023-08-29 NOTE — Progress Notes (Signed)
                Karen Sharpe, LCSW 

## 2023-09-26 ENCOUNTER — Ambulatory Visit: Payer: 59 | Admitting: Clinical

## 2023-09-26 DIAGNOSIS — F4321 Adjustment disorder with depressed mood: Secondary | ICD-10-CM

## 2023-09-26 NOTE — Progress Notes (Signed)
                Dezi Schaner, LCSW 

## 2023-09-26 NOTE — Progress Notes (Signed)
[facility] Counselor/Therapist Progress Note  Patient ID: Lauren Elliott, MRN: [id number],    Date: 09/26/2023  Time Spent: 1:34pm - 2:05pm : 31 minutes   Treatment Type: Individual Therapy  Reported Symptoms: none reported   Mental Status Exam: Appearance:  Neat and Well Groomed     Behavior: Appropriate  Motor: Normal  Speech/Language:  Clear and Coherent  Affect: Appropriate  Mood: normal  Thought process: normal  Thought content:   WNL  Sensory/Perceptual disturbances:   WNL  Orientation: oriented to person, place, and situation  Attention: Good  Concentration: Good  Memory: WNL  Fund of knowledge:  Good  Insight:   Good  Judgment:  Good  Impulse Control: Good   Risk Assessment: Danger to Self:  No Patient denied current suicidal ideation  Self-injurious Behavior: No Danger to Others: No Patient denied current homicidal ideation Duty to Warn:no Physical Aggression / Violence:No  Access to Firearms a concern: No  Gang Involvement:No   Subjective: Patient stated, "I guess pretty good" in response to events since last session. Patient reported she was referred to physical therapy for hip pain and reported plans to follow up with physical therapy. Patient stated, "pretty good" in response to patient's mood since last session. Patient stated, "I have my moments" in response to patient's mood. Patient described her "moments" as "borderline depression that doesn't last that long" and stated, "I get moody". Patient reported depressed mood lasts no longer than half a day. Patient identified finances as a trigger for decline in mood. Patient stated, "there's not anything else I can think of that would trigger that" in response to decline in mood. Patient reported financial stressors due to housing costs. Patient reported patient/significant other are still considering moving to lower housing costs.  Patient stated, "its ok, I'm tired" in response to patient's  current mood. Patient stated, "I don't know" in response to additional goals for therapy. Patient stated, "I'm feeling like maybe we touch base in a month or so" in response to goals for therapy and continuation of therapy.   Interventions: Cognitive Behavioral Therapy and Motivational Interviewing. Clinician conducted session via caregility video from clinician's office at [facility]. Patient provided verbal consent to proceed with telehealth session and is aware of limitations of telephone or video visits. Patient participated in session from patient's home. Reviewed events since last session and the status of patient's recent health concerns. Assessed patient's mood since last session and assessed patient's current mood. Assessed intensity and frequency of decline in patient's mood at times. Assisted patient in exploring and identifying triggers for decline in patient's mood. Provided psycho education related to triggers and the potential impact on mood. Clinician utilized motivational interviewing to explore additional goals for therapy.   Collaboration of Care: Other not required at this time     Diagnosis:  Adjustment Disorder with depressed mood     Plan: Patient is to utilize Cognitive Behavioral Therapy, mindfulness, coping strategies, relaxation techniques, and develop healthy sleep habits to decrease symptoms associated with Adjustment Disorder with depressed mood.    Frequency: monthly  Modality: individual    Long-term goal:   Patient stated "peace of mind" as a long term goal.     Reduce depressive symptoms as evidence by reduction in depressed mood, decreased appetite, difficulty falling and staying asleep, crying, and irritability from 3 days per week to 0 days per week. Target date of 08/29/23. Per patient on 04/01/23 she feels this goal has been met at  this time.    Short-term goal:  Develop a strategy to re-establish communication with patient's daughter. Target date of  10/01/23. As of 09/26/23 patient reported she has not contacted her daughter. Patient requested to reassess during follow up appointment on 10/30/23.    Examine patient's sleep pattern/routine to develop strategies to improve sleep habits. Target date of 02/26/23. Per patient on 04/01/23 she feels this goal has been met at this time.    Develop an understanding of the connection between patient's feelings of depressed mood, decreased appetite, difficulty falling asleep and staying asleep, crying, irritability and the impact on patient's thoughts and behaviors to develop coping strategies to utilize in response to stressors. Target date of 02/26/23. Per patient on 04/01/23 she feels this goal has been met at this time.     Doree Barthel, LCSW

## 2023-10-07 DIAGNOSIS — E876 Hypokalemia: Secondary | ICD-10-CM | POA: Diagnosis not present

## 2023-10-07 DIAGNOSIS — J449 Chronic obstructive pulmonary disease, unspecified: Secondary | ICD-10-CM | POA: Diagnosis not present

## 2023-10-07 DIAGNOSIS — E782 Mixed hyperlipidemia: Secondary | ICD-10-CM | POA: Diagnosis not present

## 2023-10-07 DIAGNOSIS — Z23 Encounter for immunization: Secondary | ICD-10-CM | POA: Diagnosis not present

## 2023-10-07 DIAGNOSIS — M8580 Other specified disorders of bone density and structure, unspecified site: Secondary | ICD-10-CM | POA: Diagnosis not present

## 2023-10-07 DIAGNOSIS — I700 Atherosclerosis of aorta: Secondary | ICD-10-CM | POA: Diagnosis not present

## 2023-10-07 DIAGNOSIS — R110 Nausea: Secondary | ICD-10-CM | POA: Diagnosis not present

## 2023-10-07 DIAGNOSIS — I10 Essential (primary) hypertension: Secondary | ICD-10-CM | POA: Diagnosis not present

## 2023-10-07 DIAGNOSIS — R7309 Other abnormal glucose: Secondary | ICD-10-CM | POA: Diagnosis not present

## 2023-10-07 DIAGNOSIS — M1990 Unspecified osteoarthritis, unspecified site: Secondary | ICD-10-CM | POA: Diagnosis not present

## 2023-10-17 DIAGNOSIS — M25552 Pain in left hip: Secondary | ICD-10-CM | POA: Diagnosis not present

## 2023-10-17 DIAGNOSIS — M25551 Pain in right hip: Secondary | ICD-10-CM | POA: Diagnosis not present

## 2023-10-22 DIAGNOSIS — M25551 Pain in right hip: Secondary | ICD-10-CM | POA: Diagnosis not present

## 2023-10-22 DIAGNOSIS — M25552 Pain in left hip: Secondary | ICD-10-CM | POA: Diagnosis not present

## 2023-10-24 DIAGNOSIS — M25551 Pain in right hip: Secondary | ICD-10-CM | POA: Diagnosis not present

## 2023-10-24 DIAGNOSIS — M25552 Pain in left hip: Secondary | ICD-10-CM | POA: Diagnosis not present

## 2023-10-29 DIAGNOSIS — M25552 Pain in left hip: Secondary | ICD-10-CM | POA: Diagnosis not present

## 2023-10-29 DIAGNOSIS — M25551 Pain in right hip: Secondary | ICD-10-CM | POA: Diagnosis not present

## 2023-10-30 ENCOUNTER — Ambulatory Visit: Payer: 59 | Admitting: Clinical

## 2023-10-30 DIAGNOSIS — F4321 Adjustment disorder with depressed mood: Secondary | ICD-10-CM

## 2023-10-30 NOTE — Progress Notes (Signed)
                Dezi Schaner, LCSW 

## 2023-10-30 NOTE — Progress Notes (Signed)
[facility] Counselor/Therapist Progress Note  Patient ID: Lauren Elliott, MRN: [id number],    Date: 10/30/2023  Time Spent: 1:32pm - 2:06pm : 34 minutes   Treatment Type: Individual Therapy  Reported Symptoms: none reported   Mental Status Exam: Appearance:  Neat and Well Groomed     Behavior: Appropriate  Motor: Normal  Speech/Language:  Clear and Coherent  Affect: Appropriate  Mood: normal  Thought process: normal  Thought content:   WNL  Sensory/Perceptual disturbances:   WNL  Orientation: oriented to person, place, and situation  Attention: Good  Concentration: Good  Memory: WNL  Fund of knowledge:  Good  Insight:   Good  Judgment:  Good  Impulse Control: Good   Risk Assessment: Danger to Self:  No Patient denied current suicidal ideation  Self-injurious Behavior: No Danger to Others: No Patient denied current homicidal ideation Duty to Warn:no Physical Aggression / Violence:No  Access to Firearms a concern: No  Gang Involvement:No   Subjective: Patient stated, "pretty good" in response to events since last session. Patient reported she recently started physical therapy and stated, "pretty good" in regards to outcome of physical therapy. Patient stated, "pretty good" in response to patient's mood since last session. Patient reported she has been spending as much time as possible outside. 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 Patient reported she has been gardening, making daughter in a law a gift, crocheting, and started some new projects. Patient reported she has not spoken to her daughter. Patient reported she plans to scan her daughter in law's baby shower invitations and email the invitations to all of patient's daughters. Patient stated, "I think a check in at some point in the future would be good".   Interventions: Cognitive Behavioral Therapy. Clinician conducted session via caregility video from clinician's  office at [facility]. Patient provided verbal consent to proceed with telehealth session and is aware of limitations of telephone or video visits. Patient participated in session from patient's home. Reviewed events since last session. Assessed patient's mood since last session. Explored and identified activities patient has been participating in that benefit patient's mood and support stability in mood. Assessed the status of patient's communication with her daughter. Discussed patient's progress in treatment and the frequency of therapy sessions. Discussed scheduling a follow up appointment in 3 months and at that time assess for the need to continue therapy.   Collaboration of Care: Other not required at this time     Diagnosis:  Adjustment Disorder with depressed mood     Plan: Patient is to utilize Cognitive Behavioral Therapy, mindfulness, coping strategies, relaxation techniques, and develop healthy sleep habits to decrease symptoms associated with Adjustment Disorder with depressed mood.    Frequency: quarterly  Modality: individual    Long-term goal:   Patient stated "peace of mind" as a long term goal.     Reduce depressive symptoms as evidence by reduction in depressed mood, decreased appetite, difficulty falling and staying asleep, crying, and irritability from 3 days per week to 0 days per week. Target date of 08/29/23. Per patient on 04/01/23 she feels this goal has been met at this time.    Short-term goal:  Develop a strategy to re-establish communication with patient's daughter. Target date of 01/29/24.    Examine patient's sleep pattern/routine to develop strategies to improve sleep habits. Target date of 02/26/23. Per patient on 04/01/23 she feels this goal has been met at this time.  Develop an understanding of the connection between patient's feelings of depressed mood, decreased appetite, difficulty falling asleep and staying asleep, crying, irritability and the impact  on patient's thoughts and behaviors to develop coping strategies to utilize in response to stressors. Target date of 02/26/23. Per patient on 04/01/23 she feels this goal has been met at this time.       Doree Barthel, LCSW

## 2023-10-31 DIAGNOSIS — M25552 Pain in left hip: Secondary | ICD-10-CM | POA: Diagnosis not present

## 2023-10-31 DIAGNOSIS — M25551 Pain in right hip: Secondary | ICD-10-CM | POA: Diagnosis not present

## 2023-11-12 DIAGNOSIS — M25551 Pain in right hip: Secondary | ICD-10-CM | POA: Diagnosis not present

## 2023-11-12 DIAGNOSIS — M25552 Pain in left hip: Secondary | ICD-10-CM | POA: Diagnosis not present

## 2023-11-14 DIAGNOSIS — M25551 Pain in right hip: Secondary | ICD-10-CM | POA: Diagnosis not present

## 2023-11-14 DIAGNOSIS — M25552 Pain in left hip: Secondary | ICD-10-CM | POA: Diagnosis not present

## 2023-11-29 DIAGNOSIS — M25551 Pain in right hip: Secondary | ICD-10-CM | POA: Diagnosis not present

## 2023-11-29 DIAGNOSIS — M25552 Pain in left hip: Secondary | ICD-10-CM | POA: Diagnosis not present

## 2024-01-01 ENCOUNTER — Other Ambulatory Visit: Payer: Self-pay | Admitting: Family Medicine

## 2024-01-01 DIAGNOSIS — Z1231 Encounter for screening mammogram for malignant neoplasm of breast: Secondary | ICD-10-CM

## 2024-01-02 ENCOUNTER — Ambulatory Visit
Admission: RE | Admit: 2024-01-02 | Discharge: 2024-01-02 | Disposition: A | Discharge status: Home / Self Care | Payer: 59 | Source: Ambulatory Visit | Attending: Family Medicine | Admitting: Family Medicine

## 2024-01-02 DIAGNOSIS — Z1231 Encounter for screening mammogram for malignant neoplasm of breast: Secondary | ICD-10-CM | POA: Diagnosis not present

## 2024-01-10 DIAGNOSIS — K0889 Other specified disorders of teeth and supporting structures: Secondary | ICD-10-CM | POA: Diagnosis not present

## 2024-01-29 ENCOUNTER — Ambulatory Visit: Payer: 59 | Admitting: Clinical

## 2024-01-29 DIAGNOSIS — F4321 Adjustment disorder with depressed mood: Secondary | ICD-10-CM | POA: Diagnosis not present

## 2024-01-29 NOTE — Progress Notes (Signed)
   Doree Barthel, LCSW

## 2024-01-29 NOTE — Progress Notes (Signed)
 [facility] Counselor/Therapist Progress Note  Patient ID: Lauren Elliott, MRN: [id number],    Date: 01/29/2024  Time Spent: 1:33pm - 2:08pm : 35 minutes   Treatment Type: Individual Therapy  Reported Symptoms: none reported   Mental Status Exam: Appearance:  Neat and Well Groomed     Behavior: Appropriate  Motor: Normal  Speech/Language:  Clear and Coherent and Normal Rate  Affect: Appropriate  Mood: normal  Thought process: normal  Thought content:   WNL  Sensory/Perceptual disturbances:   WNL  Orientation: oriented to person, place, and situation  Attention: Good  Concentration: Good  Memory: WNL  Fund of knowledge:  Good  Insight:   Good  Judgment:  Good  Impulse Control: Good   Risk Assessment: Danger to Self:  No Patient denied current suicidal ideation  Self-injurious Behavior: No Danger to Others: No Patient denied current homicidal ideation Duty to Warn:no Physical Aggression / Violence:No  Access to Firearms a concern: No  Gang Involvement:No   Subjective: Patient stated, ok in response to events since last session. Patient reported her granddaughter was born in December  and reported she was able to meet her granddaughter through zoom. Patient reported her significant other has been in the hospital twice since last session due to a nose bleed and an allergic reaction to a medication. Patient reported she recently fell and believes she cracked one or two of her ribs. Patient reported her ribs are still sore from the fall. Patient reported trying to get ready for spring and inventory items in patient's garage. Patient reported patient/significant other are still planning to move but not until possibly the fall. Patient stated, I think well and stated, good in response to patient's mood since last session and current mood. Patient reported her mood has been consistent. Patient stated, I've been feeling pretty good. Patient reported she  continues to complete jigsaw puzzles, word puzzles, and coloring. 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 Patient stated,  I guess pretty good in response to patient's feelings regarding discontinuing therapy due to patient's progress. Patient reported she will reach out to schedule an appointment if she feels she needs to resume therapy.    Interventions: Cognitive Behavioral Therapy and Motivational Interviewing.  Clinician conducted session via caregility video from clinician's office at [facility]. Patient provided verbal consent to proceed with telehealth session and is aware of limitations of telephone or video visits. Patient participated in session from patient's home. Reviewed events since last session. Assessed patient's mood since last session and current mood. Reviewed patient's progress in therapy. Discussed patient discontinuing therapy due to patient's progress. Provided validation and praised patient's progress. No follow up appointment was scheduled due to patient's progress in therapy and discontinuing therapy as a result of patient's progress   Collaboration of Care: Other not required at this time     Diagnosis:  Adjustment Disorder with depressed mood     Plan: Patient is to utilize Cognitive Behavioral Therapy, mindfulness, coping strategies, relaxation techniques, and develop healthy sleep habits to decrease symptoms associated with Adjustment Disorder with depressed mood.    Frequency: quarterly  Modality: individual    Long-term goal:   Patient stated peace of mind as a long term goal.     Reduce depressive symptoms as evidence by reduction in depressed mood, decreased appetite, difficulty falling and staying asleep, crying, and irritability from 3 days per week to 0 days per week. Target date of  08/29/23. Per patient on 04/01/23 she feels this goal has been met at this time.    Short-term goal:  Develop a strategy to  re-establish communication with patient's daughter. Target date of 01/29/24.    Examine patient's sleep pattern/routine to develop strategies to improve sleep habits. Target date of 02/26/23. Per patient on 04/01/23 she feels this goal has been met at this time.    Develop an understanding of the connection between patient's feelings of depressed mood, decreased appetite, difficulty falling asleep and staying asleep, crying, irritability and the impact on patient's thoughts and behaviors to develop coping strategies to utilize in response to stressors. Target date of 02/26/23. Per patient on 04/01/23 she feels this goal has been met at this time.       Darice Seats, LCSW

## 2024-04-15 DIAGNOSIS — Z0000 Encounter for general adult medical examination without abnormal findings: Secondary | ICD-10-CM | POA: Diagnosis not present

## 2024-04-15 DIAGNOSIS — I10 Essential (primary) hypertension: Secondary | ICD-10-CM | POA: Diagnosis not present

## 2024-04-15 DIAGNOSIS — I700 Atherosclerosis of aorta: Secondary | ICD-10-CM | POA: Diagnosis not present

## 2024-04-15 DIAGNOSIS — M8580 Other specified disorders of bone density and structure, unspecified site: Secondary | ICD-10-CM | POA: Diagnosis not present

## 2024-04-15 DIAGNOSIS — K582 Mixed irritable bowel syndrome: Secondary | ICD-10-CM | POA: Diagnosis not present

## 2024-04-15 DIAGNOSIS — E876 Hypokalemia: Secondary | ICD-10-CM | POA: Diagnosis not present

## 2024-04-15 DIAGNOSIS — J449 Chronic obstructive pulmonary disease, unspecified: Secondary | ICD-10-CM | POA: Diagnosis not present

## 2024-04-15 DIAGNOSIS — E782 Mixed hyperlipidemia: Secondary | ICD-10-CM | POA: Diagnosis not present

## 2024-04-15 DIAGNOSIS — E559 Vitamin D deficiency, unspecified: Secondary | ICD-10-CM | POA: Diagnosis not present

## 2024-10-13 DIAGNOSIS — E876 Hypokalemia: Secondary | ICD-10-CM | POA: Diagnosis not present

## 2024-10-13 DIAGNOSIS — M8580 Other specified disorders of bone density and structure, unspecified site: Secondary | ICD-10-CM | POA: Diagnosis not present

## 2024-10-13 DIAGNOSIS — E782 Mixed hyperlipidemia: Secondary | ICD-10-CM | POA: Diagnosis not present

## 2024-10-13 DIAGNOSIS — Z6820 Body mass index (BMI) 20.0-20.9, adult: Secondary | ICD-10-CM | POA: Diagnosis not present

## 2024-10-13 DIAGNOSIS — I10 Essential (primary) hypertension: Secondary | ICD-10-CM | POA: Diagnosis not present

## 2024-10-13 DIAGNOSIS — J449 Chronic obstructive pulmonary disease, unspecified: Secondary | ICD-10-CM | POA: Diagnosis not present

## 2024-10-13 DIAGNOSIS — Z23 Encounter for immunization: Secondary | ICD-10-CM | POA: Diagnosis not present
